# Patient Record
Sex: Female | Born: 1990 | Race: Black or African American | Hispanic: No | Marital: Married | Smoking: Never smoker
Health system: Southern US, Community
[De-identification: ages and names within clinical notes are randomized; demographics above are authoritative.]

## PROBLEM LIST (undated history)

## (undated) ENCOUNTER — Ambulatory Visit: Admission: EM | Payer: Medicaid Other

---

## 2008-01-01 ENCOUNTER — Ambulatory Visit: Payer: Self-pay | Admitting: Pediatrics

## 2009-01-16 IMAGING — CR DG CHEST 2V
1 series · 2 of 2 positions shown · non-contrast
Comparison: none

REASON FOR EXAM: chest pain rt lower anterior chest  fax  results
[id number]
COMMENTS:

[Series 1: view not recorded · 0.17mm/px · 2 of 2 slices shown]
[im 1/2]
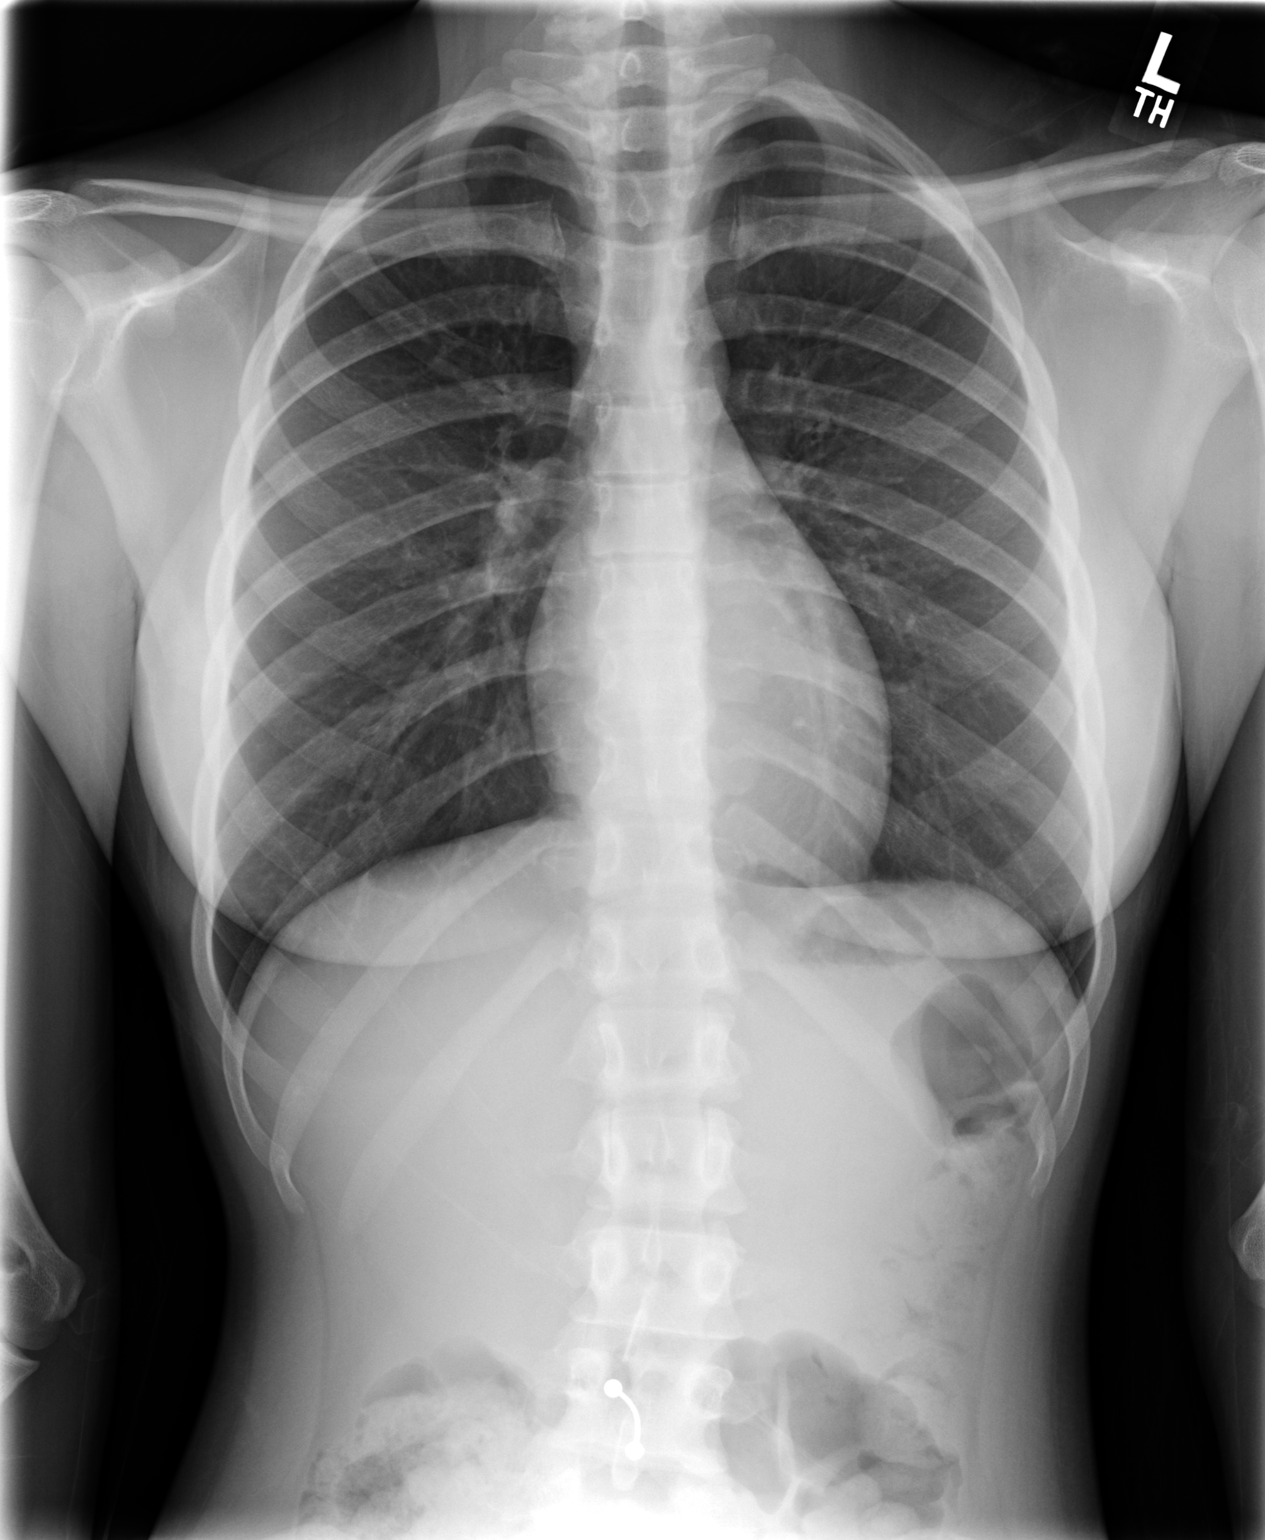
[im 2/2]
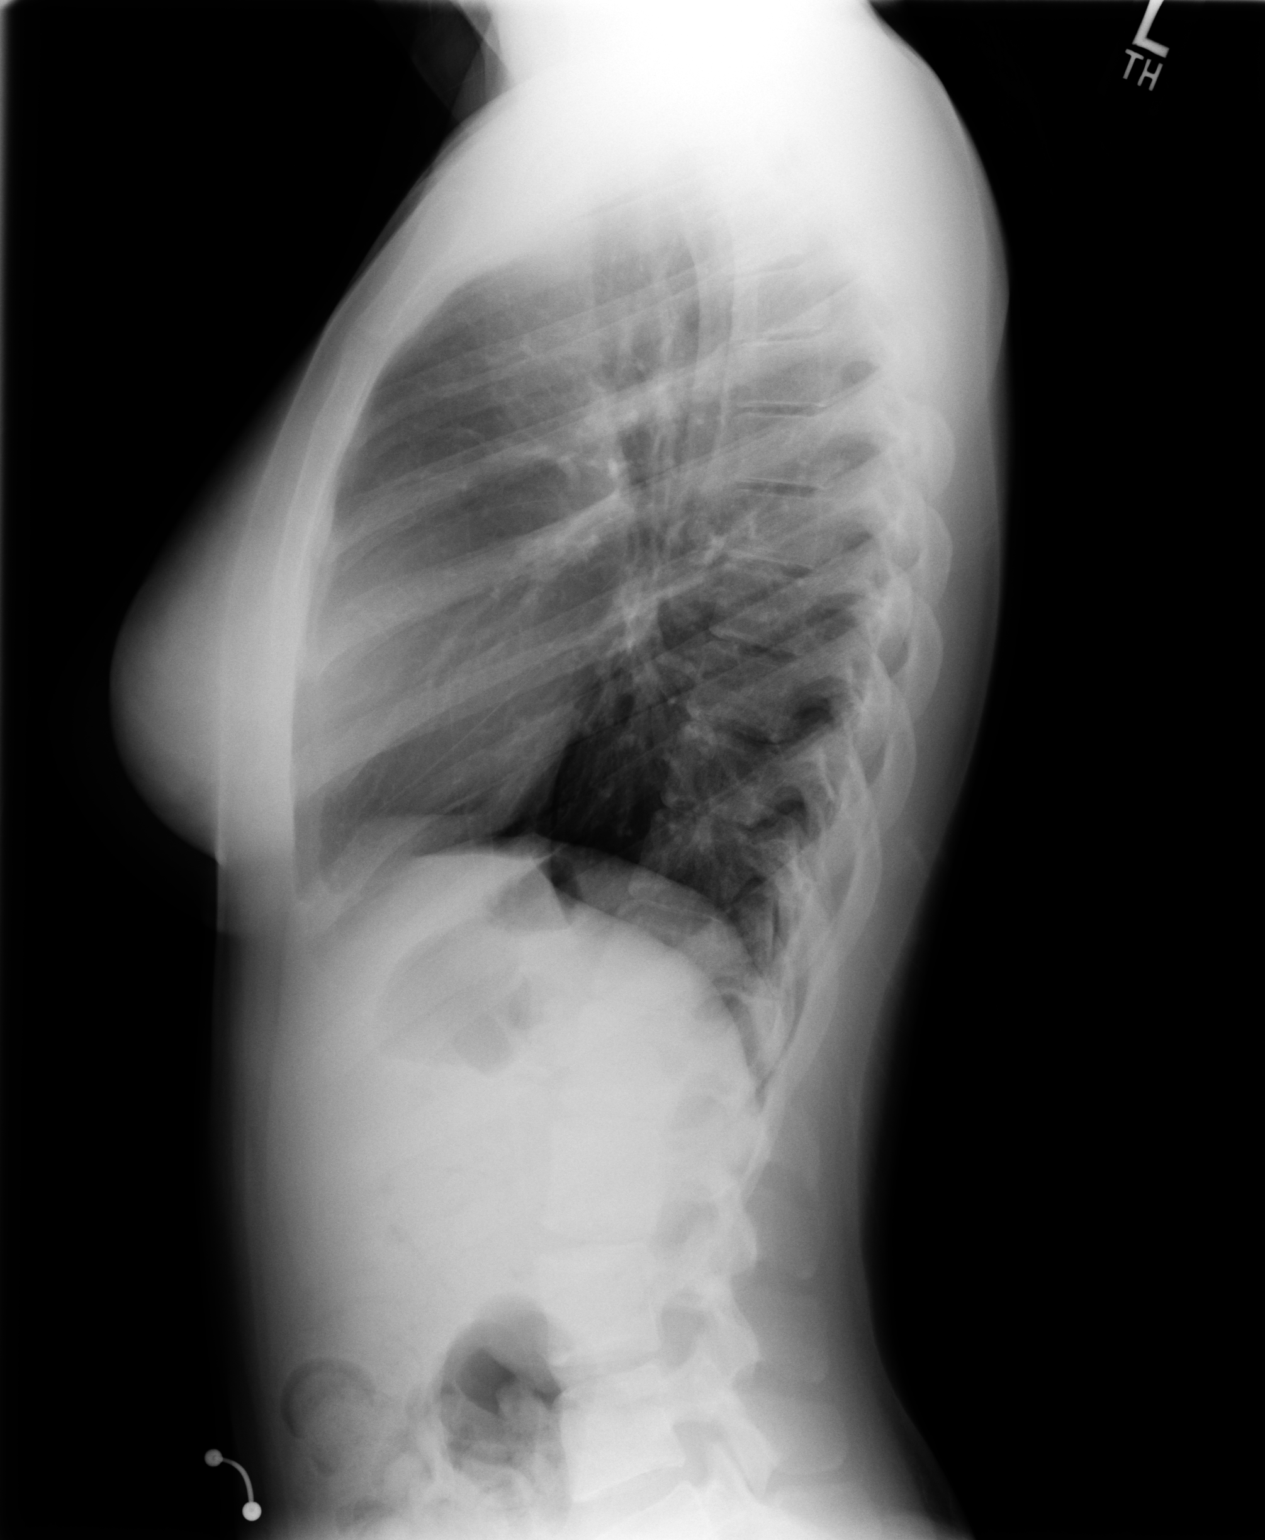

[2 of 2 positions shown; findings below may reference images not displayed]

PROCEDURE:     MDR - MDR CHEST PA(OR AP) AND LATERAL  - January 01, 2008  [DATE]

RESULT:     The lungs are mildly hyperinflated. There is no focal
infiltrate. The heart is normal in size. The pulmonary vascularity is not
engorged. The rib cage appears intact. The gas pattern in the upper abdomen
is normal. There is an umbilical ring present. The mediastinum is not
widened.
IMPRESSION: There is mild hyperinflation which may be voluntary or
which could reflect an element of reactive airway disease. There is no
evidence of pneumonia or of a pneumothorax.

## 2010-05-01 ENCOUNTER — Emergency Department: Payer: Self-pay | Admitting: Internal Medicine

## 2011-12-04 HISTORY — PX: CERVICAL CERCLAGE: SHX1329

## 2012-06-04 ENCOUNTER — Ambulatory Visit: Payer: Self-pay | Admitting: Obstetrics and Gynecology

## 2012-06-06 ENCOUNTER — Ambulatory Visit: Payer: Self-pay | Admitting: Obstetrics and Gynecology

## 2012-08-05 ENCOUNTER — Observation Stay: Payer: Self-pay | Admitting: Obstetrics and Gynecology

## 2012-08-05 LAB — URINALYSIS, COMPLETE
Bilirubin,UR: NEGATIVE
Glucose,UR: NEGATIVE mg/dL (ref 0–75)
Ketone: NEGATIVE
Leukocyte Esterase: NEGATIVE
RBC,UR: <1 /HPF (ref 0–5)
Squamous Epithelial: 1
WBC UR: 1 /HPF (ref 0–5)

## 2012-08-05 LAB — FETAL FIBRONECTIN
Appearance: NORMAL
Fetal Fibronectin: NEGATIVE

## 2012-10-22 ENCOUNTER — Inpatient Hospital Stay: Payer: Self-pay | Admitting: Obstetrics and Gynecology

## 2012-10-22 LAB — CBC WITH DIFFERENTIAL/PLATELET
Eosinophil %: 0.9 %
Lymphocyte #: 2 10*3/uL (ref 1.0–3.6)
Lymphocyte %: 22.6 %
MCH: 31.5 pg (ref 26.0–34.0)
Monocyte #: 1 x10 3/mm — ABNORMAL HIGH (ref 0.2–0.9)
Monocyte %: 11.4 %
Platelet: 198 10*3/uL (ref 150–440)

## 2012-10-23 LAB — HEMATOCRIT: HCT: 33.8 % — ABNORMAL LOW (ref 35.0–47.0)

## 2015-03-27 NOTE — Op Note (Signed)
PATIENT NAME:  Michele Salinas, Michele Salinas MR#:  [id number] DATE OF BIRTH:  May 27, 1991  DATE OF PROCEDURE:  06/06/2012  PREOPERATIVE DIAGNOSIS: Cervical insufficiency.   POSTOPERATIVE DIAGNOSIS:  Cervical insufficiency.  PROCEDURE: Emergency McDonald cervical cerclage.   ANESTHESIA: General.   PRIMARY SURGEON: Lorrene ReidAndreas M Maryela Tapper, M.D.   ASSISTANT: Deno LungerPhil Rosenow, M.D.   ESTIMATED BLOOD LOSS: Minimal.   COMPLICATIONS: None.   INTRAOPERATIVE FINDINGS: The patient had been noted on routine 20- week anatomy ultrasound to have approximately 1.5 cm of cervical dilation involving the entire length of the cervix. The length of the cervix was noted to be normal at approximately 3.5 cm.  Exam today  revealed the cervix to be visually about 1 cm dilated with view of the fetal membrane seen at the level of the internal cervical os, but no prolapse of membranes through the os itself. Following the procedure the cervix was noted to be hemostatic with closure of the cervical canal to approximately fingertip.   DRAINS OR TUBES: None.   SPECIMENS: None.   CONDITION FOLLOWING PROCEDURE: Stable. Positive fetal heart tones were noted pre- and postprocedure.   PROCEDURE IN DETAIL: Risks, benefits, and alternatives of the procedure including expectant management with hospital bed rest were discussed with the patient who elected to proceed with operative management via cervical cerclage. The patient was taken back to the operating room and placed under general endotracheal anesthesia. She was positioned in the dorsal lithotomy position and prepped and draped in the usual sterile fashion. The patient was placed in slight Trendelenburg. A short weighted speculum was placed. The cervix was clearly visualized, noting the above findings. The anterior lip of the cervix was grasped with a ring forceps and a widow needle was used to place a #2 Prolene suture for the McDonald cerclage. McDonald cerclage was placed in the usual  fashion with entry at 12:00, exit of the suture at 9:00, and repeat entry at 9:00 with exit at 6:00, entry at 6:00 with exit at 3:00, and entry at 3:00 with exit at 12:00. The suture was then tied down with the knot at 12:00. No air knots were employed.  The suture, as previously stated, was cinched down to close the cervical canal to approximately fingertip. During the procedure there was minimal bleeding noted and following tying of the suture the cervix was noted to be hemostatic. Sponge, needle, and instrument counts were correct times two.  The patient tolerated the procedure well and was taken to the recovery room in stable condition.   ____________________________ Florina OuAndreas M. Bonney AidStaebler, MD ams:bjt D: 06/07/2012 08:10:14 ET T: 06/07/2012 14:15:42 ET JOB#: [id number]  cc: Florina OuAndreas M. Bonney AidStaebler, MD, <Dictator> Carmel SacramentoANDREAS Cathrine MusterM Permelia Bamba MD ELECTRONICALLY SIGNED 06/15/2012 11:11

## 2015-04-12 NOTE — H&P (Signed)
L&D Evaluation:  History Expanded:   HPI 24 yo G3P0020 at 29 weeks pregnancy who is having abdomin\al pain. it is mostly with fetal movement. the patient has the significant history for cerclage on July 5 of this pregnancy. she is not bleeding and ni leakage of fluid.    Gravida 3    Term 0    PreTerm 0    Abortion 2    Living 0    Blood Type (Maternal) O positive    Group B Strep Results Maternal (Result >5wks must be treated as unknown) unknown/result > 5 weeks ago    Maternal HIV Negative    Maternal Syphilis Ab Nonreactive    Maternal Varicella Non-Immune    Rubella Results (Maternal) immune    Maternal T-Dap Immune    [facility]    Presents with abdominal pain    Patient's Medical History other    Patient's Surgical History none    Medications Pre Natal Vitamins    Allergies NKDA    Social History none    Family History Non-Contributory    Current Prenatal Course Notable For Incompetent Cervix   ROS:   ROS All systems were reviewed.  HEENT, CNS, GI, GU, Respiratory, CV, Renal and Musculoskeletal systems were found to be normal.   Exam:   Vital Signs stable    Urine Protein negative dipstick    General no apparent distress    Mental Status clear    Chest clear    Heart normal sinus rhythm    Abdomen gravid, non-tender    Estimated Fetal Weight Average for gestational age    Back no CVAT    Pelvic cervix closed and thick    Mebranes Intact    FHT normal rate with no decels    FHT Description strictly reactive no decels no contractions,    Ucx absent    Skin dry   Impression:   Impression active fetal movement   Plan:   Plan UA, EFM/NST, monitor contractions and for cervical change    Comments FFN   Electronic Signatures: Michele Salinas, Michele Salinas (MD)  (Signed 03-Sep-13 14:53)  Authored: L&D Evaluation   Last Updated: 03-Sep-13 14:53 by Michele Salinas, Danthony Kendrix (MD)

## 2017-10-07 ENCOUNTER — Other Ambulatory Visit: Payer: Self-pay

## 2017-10-07 ENCOUNTER — Emergency Department
Admission: EM | Admit: 2017-10-07 | Discharge: 2017-10-07 | Disposition: A | Discharge status: Home / Self Care | Payer: 59 | Attending: Emergency Medicine | Admitting: Emergency Medicine

## 2017-10-07 ENCOUNTER — Encounter: Payer: Self-pay | Admitting: Emergency Medicine

## 2017-10-07 DIAGNOSIS — M25561 Pain in right knee: Secondary | ICD-10-CM | POA: Diagnosis present

## 2017-10-07 DIAGNOSIS — Y999 Unspecified external cause status: Secondary | ICD-10-CM | POA: Insufficient documentation

## 2017-10-07 DIAGNOSIS — S8001XA Contusion of right knee, initial encounter: Secondary | ICD-10-CM | POA: Diagnosis not present

## 2017-10-07 DIAGNOSIS — Y929 Unspecified place or not applicable: Secondary | ICD-10-CM | POA: Diagnosis not present

## 2017-10-07 DIAGNOSIS — Y939 Activity, unspecified: Secondary | ICD-10-CM | POA: Insufficient documentation
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
## 2017-10-29 ENCOUNTER — Other Ambulatory Visit: Payer: Self-pay

## 2017-10-29 ENCOUNTER — Encounter (HOSPITAL_COMMUNITY): Payer: Self-pay | Admitting: Emergency Medicine

## 2017-10-29 ENCOUNTER — Ambulatory Visit (HOSPITAL_COMMUNITY)
Admission: EM | Admit: 2017-10-29 | Discharge: 2017-10-29 | Disposition: A | Discharge status: Home / Self Care | Payer: Self-pay | Attending: Family Medicine | Admitting: Family Medicine

## 2017-10-29 DIAGNOSIS — M62838 Other muscle spasm: Secondary | ICD-10-CM

## 2017-10-29 DIAGNOSIS — M62830 Muscle spasm of back: Secondary | ICD-10-CM

## 2017-10-29 MED ORDER — DICLOFENAC SODIUM 75 MG PO TBEC: 75.0000 mg | DELAYED_RELEASE_TABLET | Freq: Two times a day (BID) | ORAL | 0 refills | Status: DC

## 2017-10-29 MED ORDER — CYCLOBENZAPRINE HCL 10 MG PO TABS: 10.0000 mg | ORAL_TABLET | Freq: Three times a day (TID) | ORAL | 0 refills | Status: DC
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
## 2018-01-07 ENCOUNTER — Other Ambulatory Visit: Payer: Self-pay

## 2018-01-07 ENCOUNTER — Ambulatory Visit
Admission: EM | Admit: 2018-01-07 | Discharge: 2018-01-07 | Disposition: A | Discharge status: Home / Self Care | Payer: 59 | Attending: Family Medicine | Admitting: Family Medicine

## 2018-01-07 ENCOUNTER — Encounter: Payer: Self-pay | Admitting: *Deleted

## 2018-01-07 DIAGNOSIS — J069 Acute upper respiratory infection, unspecified: Secondary | ICD-10-CM

## 2018-01-07 MED ORDER — HYDROCOD POLST-CPM POLST ER 10-8 MG/5ML PO SUER: 5.0000 mL | Freq: Two times a day (BID) | ORAL | 0 refills | Status: DC

## 2018-01-07 MED ORDER — BENZONATATE 200 MG PO CAPS: ORAL_CAPSULE | ORAL | 0 refills | Status: DC

## 2018-01-07 NOTE — ED Provider Notes (Signed)
[facility]    CSN: [id number] Arrival date & time: 01/07/18  1039     History   Chief Complaint Chief Complaint  Patient presents with  . Cough  . Generalized Body Aches  . Chills    HPI Michele Salinas is a 27 y.o. female.   HPI  27 year old female accompanied by her mother presents with of cough chest congestion and discomfort when coughing body aches and fever up to 101 she started having yesterday.  Today she does feel better.  The patient does not look ill or toxic.  States she is afebrile blood pressure 121/70 and O2 sats 100% on room air.  Her daughter had similar symptoms earlier in the week.  May been the source of her illness.      History reviewed. No pertinent past medical history.  There are no active problems to display for this patient.   History reviewed. No pertinent surgical history.  OB History    No data available       Home Medications    Prior to Admission medications   Medication Sig Start Date End Date Taking? Authorizing Provider  benzonatate (TESSALON) 200 MG capsule Take one cap TID PRN cough 01/07/18   Lutricia Feiloemer, William P, PA-C  chlorpheniramine-HYDROcodone [facility](TUSSIONEX PENNKINETIC ER) 10-8 MG/5ML SUER Take 5 mLs by mouth 2 (two) times daily. 01/07/18   Lutricia Feiloemer, William P, PA-C    Family History Family History  Problem Relation Age of Onset  . Healthy Mother   . Hypertension Father     Social History Social History   Tobacco Use  . Smoking status: Never Smoker  . Smokeless tobacco: Never Used  Substance Use Topics  . Alcohol use: Yes    Comment: occasional  . Drug use: No     Allergies   Patient has no known allergies.   Review of Systems Review of Systems  Constitutional: Positive for activity change and chills. Negative for fatigue and fever.  HENT: Positive for congestion.   Respiratory: Positive for cough. Negative for shortness of breath, wheezing and stridor.   All other systems reviewed and are  negative.    Physical Exam Triage Vital Signs ED Triage Vitals  Enc Vitals Group     BP 01/07/18 1047 121/70     Pulse Rate 01/07/18 1047 96     Resp 01/07/18 1047 16     Temp 01/07/18 1047 98.6 F (37 C)     Temp Source 01/07/18 1047 Oral     SpO2 01/07/18 1047 100 %     Weight 01/07/18 1049 130 lb (59 kg)     Height 01/07/18 1049 5\' 2"  (1.575 m)     Head Circumference --      Peak Flow --      Pain Score 01/07/18 1049 0     Pain Loc --      Pain Edu? --      Excl. in GC? --    No data found.  Updated Vital Signs BP 121/70 (BP Location: Left Arm)   Pulse 96   Temp 98.6 F (37 C) (Oral)   Resp 16   Ht 5\' 2"  (1.575 m)   Wt 130 lb (59 kg)   LMP 12/18/2017 (Approximate)   SpO2 100%   BMI 23.78 kg/m   Visual Acuity Right Eye Distance:   Left Eye Distance:   Bilateral Distance:    Right Eye Near:   Left Eye Near:    Bilateral Near:  Physical Exam  Constitutional: She is oriented to person, place, and time. She appears well-developed and well-nourished. No distress.  HENT:  Head: Normocephalic.  Right Ear: External ear normal.  Left Ear: External ear normal.  Nose: Nose normal.  Mouth/Throat: Oropharynx is clear and moist. No oropharyngeal exudate.  Eyes: Pupils are equal, round, and reactive to light. Right eye exhibits no discharge. Left eye exhibits no discharge.  Neck: Normal range of motion.  Pulmonary/Chest: Effort normal and breath sounds normal.  Musculoskeletal: Normal range of motion.  Lymphadenopathy:    She has no cervical adenopathy.  Neurological: She is alert and oriented to person, place, and time.  Skin: Skin is warm and dry. She is not diaphoretic.  Psychiatric: She has a normal mood and affect. Her behavior is normal. Judgment and thought content normal.  Nursing note and vitals reviewed.    UC Treatments / Results  Labs (all labs ordered are listed, but only abnormal results are displayed) Labs Reviewed - No data to  display  EKG  EKG Interpretation None       Radiology No results found.  Procedures Procedures (including critical care time)  Medications Ordered in UC Medications - No data to display   Initial Impression / Assessment and Plan / UC Course  I have reviewed the triage vital signs and the nursing notes.  Pertinent labs & imaging results that were available during my care of the patient were reviewed by me and considered in my medical decision making (see chart for details).     Plan: 1. Test/x-ray results and diagnosis reviewed with patient 2. rx as per orders; risks, benefits, potential side effects reviewed with patient 3. Recommend supportive treatment with rest and fluids.  Treat symptomatically.  Told patient this is most likely viral and does not require any antibiotics.  If however she continued to have symptoms in 1 week would recommend she follow-up with her primary care physician. 4. F/u prn if symptoms worsen or don't improve   Final Clinical Impressions(s) / UC Diagnoses   Final diagnoses:  Upper respiratory tract infection, unspecified type    ED Discharge Orders        Ordered    benzonatate (TESSALON) 200 MG capsule     01/07/18 1201    chlorpheniramine-HYDROcodone (TUSSIONEX PENNKINETIC ER) 10-8 MG/5ML SUER  2 times daily     01/07/18 1201       Controlled Substance Prescriptions [place] Controlled Substance Registry consulted? Not Applicable   Lutricia Feil, PA-C 01/07/18 1214

## 2018-01-07 NOTE — ED Triage Notes (Signed)
PAtient started having symptoms of cough, chest congestion, body aches and fever yesterday.

## 2018-01-10 ENCOUNTER — Telehealth: Payer: Self-pay | Admitting: Emergency Medicine

## 2018-01-10 NOTE — Telephone Encounter (Signed)
Called to follow-up with patient [facility].  Patient states that they are feeling better.

## 2018-10-01 DIAGNOSIS — R350 Frequency of micturition: Secondary | ICD-10-CM | POA: Diagnosis not present

## 2019-04-21 ENCOUNTER — Ambulatory Visit: Payer: Self-pay | Admitting: Obstetrics and Gynecology

## 2019-04-23 ENCOUNTER — Ambulatory Visit: Payer: 59 | Admitting: Obstetrics and Gynecology

## 2019-05-01 ENCOUNTER — Ambulatory Visit (INDEPENDENT_AMBULATORY_CARE_PROVIDER_SITE_OTHER): Payer: 59 | Admitting: Obstetrics and Gynecology

## 2019-05-01 ENCOUNTER — Other Ambulatory Visit: Payer: Self-pay

## 2019-05-01 ENCOUNTER — Encounter: Payer: Self-pay | Admitting: Obstetrics and Gynecology

## 2019-05-01 VITALS — BP 112/60 | HR 81 | Ht 62.0 in | Wt 135.0 lb

## 2019-05-01 DIAGNOSIS — Z789 Other specified health status: Secondary | ICD-10-CM | POA: Diagnosis not present

## 2019-05-01 DIAGNOSIS — Z3009 Encounter for other general counseling and advice on contraception: Secondary | ICD-10-CM | POA: Diagnosis not present

## 2019-05-01 MED ORDER — NORELGESTROMIN-ETH ESTRADIOL 150-35 MCG/24HR TD PTWK: 1.0000 | MEDICATED_PATCH | TRANSDERMAL | 12 refills | Status: DC

## 2019-05-01 NOTE — Progress Notes (Signed)
Patient ID: Michele Salinas, female   DOB: Dec 11, 1990, 28 y.o.   MRN: [id number]  Reason for Consult: Contraception (Nexplanon removal, would like BC to replace )   Referred by [facility] [place] [facility]  Subjective:     HPI:  Michele Salinas is a 28 y.o. female. She would like to transition from a Nexplanon to a different contraceptive device. She was happy with the Nexplanon, but she recently passed 3 years with the device. She would like to switch to the contraceptive pill, patch or ring which is easier to stop because she would like to have another child in the future.  After discussing options she decided to try the patch.   History reviewed. No pertinent past medical history. Family History  Problem Relation Age of Onset  . Healthy Mother   . Hypertension Father    Past Surgical History:  Procedure Laterality Date  . CERVICAL CERCLAGE  2013    Short Social History:  Social History   Tobacco Use  . Smoking status: Never Smoker  . Smokeless tobacco: Never Used  Substance Use Topics  . Alcohol use: Yes    Comment: occasional    No Known Allergies  Current Outpatient Medications  Medication Sig Dispense Refill  . norelgestromin-ethinyl estradiol [facility]) 150-35 MCG/24HR transdermal patch Place 1 patch onto the skin once a week. 3 patch 12   No current facility-administered medications for this visit.     Review of Systems  Constitutional: Negative for chills, fatigue, fever and unexpected weight change.  HENT: Negative for trouble swallowing.  Eyes: Negative for loss of vision.  Respiratory: Negative for cough, shortness of breath and wheezing.  Cardiovascular: Negative for chest pain, leg swelling, palpitations and syncope.  GI: Negative for abdominal pain, blood in stool, diarrhea, nausea and vomiting.  GU: Negative for difficulty urinating, dysuria, frequency and hematuria.  Musculoskeletal: Negative for back pain, leg pain and joint pain.  Skin:  Negative for rash.  Neurological: Negative for dizziness, headaches, light-headedness, numbness and seizures.  Psychiatric: Negative for behavioral problem, confusion, depressed mood and sleep disturbance.        Objective:  Objective   Vitals:   05/01/19 1332  BP: 112/60  Pulse: 81  Weight: 135 lb (61.2 kg)  Height: 5\' 2"  (1.575 m)   Body mass index is 24.69 kg/m.  Physical Exam Vitals signs and nursing note reviewed.  Constitutional:      Appearance: She is well-developed.  HENT:     Head: Normocephalic and atraumatic.  Eyes:     Pupils: Pupils are equal, round, and reactive to light.  Cardiovascular:     Rate and Rhythm: Normal rate and regular rhythm.  Pulmonary:     Effort: Pulmonary effort is normal. No respiratory distress.  Skin:    General: Skin is warm and dry.  Neurological:     Mental Status: She is alert and oriented to person, place, and time.  Psychiatric:        Behavior: Behavior normal.        Thought Content: Thought content normal.        Judgment: Judgment normal.       Assessment/Plan:      28 yo here for birth control discussion and care Would like to transition from a nexplanon to a patch- Discussed options for transitioning methods of the overlap or back up method. She would like to use the overlap method. She will initiate xulane today and will return in 7 days  to have the nexplanon removed. Discussed Annual and pap smear which we can complete next week as well.   More than 20 minutes were spent face to face with the patient in the room with more than 50% of the time spent providing counseling and discussing the plan of management.    Adelene Idlerhristanna Nyeisha Goodall MD [facility], [facility] 05/01/2019 2:11 PM

## 2019-05-13 DIAGNOSIS — F418 Other specified anxiety disorders: Secondary | ICD-10-CM | POA: Diagnosis not present

## 2019-05-21 ENCOUNTER — Ambulatory Visit (INDEPENDENT_AMBULATORY_CARE_PROVIDER_SITE_OTHER): Payer: 59 | Admitting: Obstetrics and Gynecology

## 2019-05-21 ENCOUNTER — Other Ambulatory Visit: Payer: Self-pay

## 2019-05-21 ENCOUNTER — Encounter: Payer: Self-pay | Admitting: Obstetrics and Gynecology

## 2019-05-21 VITALS — BP 98/52 | HR 62 | Ht 62.0 in | Wt 138.0 lb

## 2019-05-21 DIAGNOSIS — Z3046 Encounter for surveillance of implantable subdermal contraceptive: Secondary | ICD-10-CM

## 2019-05-21 DIAGNOSIS — Z3049 Encounter for surveillance of other contraceptives: Secondary | ICD-10-CM

## 2019-05-21 NOTE — Progress Notes (Signed)
  Nexplanon removal Procedure note - The Nexplanon was noted in the patient's arm and the end was identified. The skin was cleansed with a Betadine solution. A small injection of subcutaneous lidocaine with epinephrine was given over the end of the implant. An incision was made at the end of the implant. The rod was noted in the incision and grasped with a hemostat. It was noted to be intact.  Steri-Strip was placed approximating the incision. Hemostasis was noted. She will continue with xulane for contraception. She initiated xulane 2 weeks ago. She will return for an annual soon.   Homero Fellers ,MD 05/21/2019,2:11 PM

## 2019-06-08 DIAGNOSIS — F418 Other specified anxiety disorders: Secondary | ICD-10-CM | POA: Diagnosis not present

## 2019-06-21 ENCOUNTER — Other Ambulatory Visit: Payer: Self-pay | Admitting: Obstetrics and Gynecology

## 2019-06-21 DIAGNOSIS — Z789 Other specified health status: Secondary | ICD-10-CM

## 2019-06-21 DIAGNOSIS — Z3009 Encounter for other general counseling and advice on contraception: Secondary | ICD-10-CM

## 2019-06-24 ENCOUNTER — Telehealth: Payer: Self-pay

## 2019-06-24 NOTE — Telephone Encounter (Signed)
Patient is calling to speak with a nurse.    Please advise

## 2019-06-24 NOTE — Telephone Encounter (Signed)
Spoke with pt she is stating she is having bad cramping from the patch and nausea. She would like to go back to Lo Loestrin if possible. I have advise pt that CRS is not in office today but will send to her and she will see it tomorrow

## 2019-06-24 NOTE — Telephone Encounter (Signed)
Pt calling; bc was changed to patches 11m ago; feels sick on her stomach all the time c the patch.  Can she go back to Lo Loestrin?  [phone]

## 2019-06-25 ENCOUNTER — Other Ambulatory Visit: Payer: Self-pay | Admitting: Obstetrics and Gynecology

## 2019-06-25 DIAGNOSIS — Z3009 Encounter for other general counseling and advice on contraception: Secondary | ICD-10-CM

## 2019-06-25 MED ORDER — LO LOESTRIN FE 1 MG-10 MCG / 10 MCG PO TABS: 1.0000 | ORAL_TABLET | Freq: Every day | ORAL | 3 refills | Status: DC

## 2019-06-25 NOTE — Telephone Encounter (Signed)
Please inform that prescription was sent. Thank you!

## 2019-06-25 NOTE — Telephone Encounter (Signed)
Pt aware via VM

## 2019-07-01 NOTE — Telephone Encounter (Signed)
Spoke w/patient. She reports she stopped the xulane (literally took it off about an hour ago) d/t nausea she just couldn't stand it any longer. She was on Lo Lo before. Advised to check her insurance coverage by her plan to see what is covered and call back to notify us prior to anything additional being sent in. Pt will call back by tomorrow. Pt aware [facility] out of office til tomorrow.

## 2019-07-01 NOTE — Telephone Encounter (Signed)
Pt states she received notification from her pharmacy that the new OCP rx that was sent in isn't covered by her insurance [facility]). Pt requesting rx that is covered. GD#[phone]

## 2019-07-02 ENCOUNTER — Other Ambulatory Visit: Payer: Self-pay | Admitting: Obstetrics and Gynecology

## 2019-07-02 DIAGNOSIS — Z3009 Encounter for other general counseling and advice on contraception: Secondary | ICD-10-CM

## 2019-07-02 MED ORDER — LARIN 24 FE 1-20 MG-MCG(24) PO TABS: 1.0000 | ORAL_TABLET | Freq: Every day | ORAL | 11 refills | Status: DC

## 2019-08-19 ENCOUNTER — Telehealth: Payer: Self-pay

## 2019-08-19 DIAGNOSIS — Z3009 Encounter for other general counseling and advice on contraception: Secondary | ICD-10-CM

## 2019-08-19 MED ORDER — LARIN 24 FE 1-20 MG-MCG(24) PO TABS: 1.0000 | ORAL_TABLET | Freq: Every day | ORAL | 10 refills | Status: DC

## 2019-08-19 NOTE — Telephone Encounter (Signed)
LMVM TRC. Notified of med on back order per pharmacy. Pt advised Gilman Schmidt out of office today. Pt TRC to notify how soon she needs refill.

## 2019-08-19 NOTE — Telephone Encounter (Signed)
Patient is took last pill last night . Please advise

## 2019-08-19 NOTE — Telephone Encounter (Signed)
Pharmacist w/[facility] calling to notify that Michele Salinas/Junel 24 FE is on back order. Requesting permission to change to Norethendrone Ethinyl Estradiol 1/20 w/o FE #21.

## 2019-08-19 NOTE — Telephone Encounter (Signed)
Notified pt per ABC, this seems to be a [facility] issue as this [facility] is available at [facility]. Patient requested rx be sent to [facility]. Rx sent #1 w/10 refills.

## 2019-10-04 ENCOUNTER — Other Ambulatory Visit: Payer: Self-pay | Admitting: Obstetrics and Gynecology

## 2019-10-04 DIAGNOSIS — Z3009 Encounter for other general counseling and advice on contraception: Secondary | ICD-10-CM

## 2019-12-17 ENCOUNTER — Telehealth: Payer: Self-pay

## 2019-12-17 NOTE — Telephone Encounter (Signed)
Patient states she is expecting and has a few questions in regards to symptoms. VQ#[phone]

## 2019-12-17 NOTE — Telephone Encounter (Signed)
Spoke w/pt. She reports eating at [employer] yesterday and had bad stomach cramps about an hour later followed by diarrhea. She feels better today and bowel movements have returned to normal. She denies vaginal bleeding/spotting.  Reports having a low grade fever 99.1-100. Advised to monitor. Can take Tylenol. Will call back with any additional s&s/concerns.

## 2019-12-23 ENCOUNTER — Telehealth: Payer: Self-pay

## 2019-12-23 NOTE — Telephone Encounter (Signed)
Pt calling; is early preg - LMP 11/08/19; c/o had some spotting, not keeping anything down, feels weak; covid test neg; HROB; has concerns.  [phone]  Pt states had IC within 24hrs of spotting; adv probably from that.  Pt wants to know if needs to abstain from IC at least until NOB appt on 2/8 - had cerclage last preg.  Pt did keep one meal down yesterday.  Adv if doesn't keep liquids down x24hrs to be seen.  Can she have the chewable tablet for nausea rx'd?  Adv will send msg to [facility]MS.

## 2019-12-23 NOTE — Telephone Encounter (Signed)
Advise

## 2019-12-23 NOTE — Telephone Encounter (Signed)
Abstaining from intercourse would avoid additional cramping so might be reasonable until we see her

## 2019-12-24 ENCOUNTER — Other Ambulatory Visit: Payer: Self-pay | Admitting: Advanced Practice Midwife

## 2019-12-24 DIAGNOSIS — O219 Vomiting of pregnancy, unspecified: Secondary | ICD-10-CM

## 2019-12-24 MED ORDER — ONDANSETRON 4 MG PO TBDP: 4.0000 mg | ORAL_TABLET | Freq: Four times a day (QID) | ORAL | 2 refills | Status: DC | PRN

## 2019-12-24 NOTE — Telephone Encounter (Signed)
Please send in nausea medication until her appointment. [facility]MS is out of the office

## 2019-12-24 NOTE — Telephone Encounter (Signed)
Please let her know what AMS said about IC. Also I sent zofran ODT to her pharmacy (dissolvable) I think that is what she meant by chewable.

## 2019-12-24 NOTE — Progress Notes (Unsigned)
Rx zofran 4 mg ODT sent to patient's pharmacy.

## 2020-01-11 ENCOUNTER — Other Ambulatory Visit: Payer: Self-pay

## 2020-01-11 ENCOUNTER — Ambulatory Visit (INDEPENDENT_AMBULATORY_CARE_PROVIDER_SITE_OTHER): Payer: BC Managed Care – PPO | Admitting: Obstetrics and Gynecology

## 2020-01-11 ENCOUNTER — Encounter: Payer: Self-pay | Admitting: Obstetrics and Gynecology

## 2020-01-11 VITALS — BP 124/62 | Wt 136.0 lb

## 2020-01-11 DIAGNOSIS — Z3689 Encounter for other specified antenatal screening: Secondary | ICD-10-CM

## 2020-01-11 DIAGNOSIS — Z9889 Other specified postprocedural states: Secondary | ICD-10-CM | POA: Insufficient documentation

## 2020-01-11 DIAGNOSIS — Z113 Encounter for screening for infections with a predominantly sexual mode of transmission: Secondary | ICD-10-CM

## 2020-01-11 DIAGNOSIS — Z3480 Encounter for supervision of other normal pregnancy, unspecified trimester: Secondary | ICD-10-CM | POA: Insufficient documentation

## 2020-01-11 DIAGNOSIS — Z3201 Encounter for pregnancy test, result positive: Secondary | ICD-10-CM

## 2020-01-11 DIAGNOSIS — O09299 Supervision of pregnancy with other poor reproductive or obstetric history, unspecified trimester: Secondary | ICD-10-CM | POA: Insufficient documentation

## 2020-01-11 DIAGNOSIS — Z3A09 9 weeks gestation of pregnancy: Secondary | ICD-10-CM

## 2020-01-11 DIAGNOSIS — O0990 Supervision of high risk pregnancy, unspecified, unspecified trimester: Secondary | ICD-10-CM | POA: Insufficient documentation

## 2020-01-11 DIAGNOSIS — O3430 Maternal care for cervical incompetence, unspecified trimester: Secondary | ICD-10-CM | POA: Insufficient documentation

## 2020-01-11 LAB — POCT URINE PREGNANCY: Preg Test, Ur: POSITIVE — AB

## 2020-01-11 NOTE — Addendum Note (Signed)
Addended by: Lorrene Reid on: 01/11/2020 12:56 PM   Modules accepted: Orders

## 2020-01-11 NOTE — Progress Notes (Signed)
NOB   Nausea 

## 2020-01-11 NOTE — Progress Notes (Signed)
New Obstetric Patient H&P    Chief Complaint: "Desires prenatal care"   History of Present Illness: Patient is a 29 y.o. G2P1001 Not Hispanic or Latino female, presents with amenorrhea and positive home pregnancy test. Patient's last menstrual period was 11/08/2019 (exact date). and based on her  LMP, her EDD is Estimated Date of Delivery: 08/14/20 and her EGA is [redacted]w[redacted]d. Cycles are regular monthly.   She had a urine pregnancy test which was positive 1 month(s)  ago. Her last menstrual period was normal. Since her LMP she claims she has experienced nausea, fatigue. She denies vaginal bleeding. Her past medical history is noncontributory. Her prior pregnancies are notable for cervical insufficency managed with cerclarge last pregnancy  Since her LMP, she admits to the use of tobacco products  no There are cats in the home in the home  no  She admits close contact with children on a regular basis  yes  She has had chicken pox in the past no She has had Tuberculosis exposures, symptoms, or previously tested positive for TB   no Current or past history of domestic violence. no  Genetic Screening/Teratology Counseling: (Includes patient, baby's father, or anyone in either family with:)   77. Patient's age >/= 31 at [facility]  no 2. Thalassemia ([place], [place], [place], or Asian background): MCV<80  no 3. Neural tube defect (meningomyelocele, spina bifida, anencephaly)  no 4. Congenital heart defect  no  5. Down syndrome  no 6. Tay-Sachs (Jewish, [place])  no 7. Canavan's Disease  no 8. Sickle cell disease or trait (African)  no  9. Hemophilia or other blood disorders  no  10. Muscular dystrophy  no  11. Cystic fibrosis  no  12. Huntington's Chorea  no  13. Mental retardation/autism  no 14. Other inherited genetic or chromosomal disorder  no 15. Maternal metabolic disorder (DM, PKU, etc)  no 16. Patient or FOB with a child with a birth defect not listed above no  16a. Patient or  FOB with a birth defect themselves no 17. Recurrent pregnancy loss, or stillbirth  no  18. Any medications since LMP other than prenatal vitamins (include vitamins, supplements, OTC meds, drugs, alcohol)  no 19. Any other genetic/environmental exposure to discuss  no  Infection History:   1. Lives with someone with TB or TB exposed  no  2. Patient or partner has history of genital herpes  no 3. Rash or viral illness since LMP  no 4. History of STI (GC, CT, HPV, syphilis, HIV)  no 5. History of recent travel :  no  Other pertinent information:  no     Review of Systems:10 point review of systems negative unless otherwise noted in HPI  Past Medical History:  History reviewed. No pertinent past medical history.  Past Surgical History:  Past Surgical History:  Procedure Laterality Date  . CERVICAL CERCLAGE  2013    Gynecologic History: Patient's last menstrual period was 11/08/2019 (exact date).  Obstetric History: G2P1001  Family History:  Family History  Problem Relation Age of Onset  . Healthy Mother   . Hypertension Father     Social History:  Social History   Socioeconomic History  . Marital status: Married    Spouse name: Not on file  . Number of children: Not on file  . Years of education: Not on file  . Highest education level: Not on file  Occupational History  . Not on file  Tobacco Use  . Smoking status: Never  Smoker  . Smokeless tobacco: Never Used  Substance and Sexual Activity  . Alcohol use: Yes    Comment: occasional  . Drug use: No  . Sexual activity: Yes    Birth control/protection: None  Other Topics Concern  . Not on file  Social History Narrative  . Not on file   Social Determinants of Health   Financial Resource Strain:   . Difficulty of Paying Living Expenses: Not on file  Food Insecurity:   . Worried About Programme researcher, broadcasting/film/video in the Last Year: Not on file  . Ran Out of Food in the Last Year: Not on file  Transportation Needs:    . Lack of Transportation (Medical): Not on file  . Lack of Transportation (Non-Medical): Not on file  Physical Activity:   . Days of Exercise per Week: Not on file  . Minutes of Exercise per Session: Not on file  Stress:   . Feeling of Stress : Not on file  Social Connections:   . Frequency of Communication with Friends and Family: Not on file  . Frequency of Social Gatherings with Friends and Family: Not on file  . Attends Religious Services: Not on file  . Active Member of Clubs or Organizations: Not on file  . Attends Banker Meetings: Not on file  . Marital Status: Not on file  Intimate Partner Violence:   . Fear of Current or Ex-Partner: Not on file  . Emotionally Abused: Not on file  . Physically Abused: Not on file  . Sexually Abused: Not on file    Allergies:  No Known Allergies  Medications: Prior to Admission medications   Medication Sig Start Date End Date Taking? Authorizing Provider  ondansetron (ZOFRAN ODT) 4 MG disintegrating tablet Take 1 tablet (4 mg total) by mouth every 6 (six) hours as needed for nausea. 12/24/19  Yes Tresea Mall, CNM    Physical Exam Vitals: Blood pressure 124/62, weight 136 lb (61.7 kg), last menstrual period 11/08/2019.  General: NAD HEENT: normocephalic, anicteric Thyroid: no enlargement, no palpable nodules Pulmonary: No increased work of breathing, CTAB Cardiovascular: RRR, distal pulses 2+ Abdomen: NABS, soft, non-tender, non-distended.  Umbilicus without lesions.  No hepatomegaly, splenomegaly or masses palpable. No evidence of hernia  Genitourinary: declined pelvic and pap today Extremities: no edema, erythema, or tenderness Neurologic: Grossly intact Psychiatric: mood appropriate, affect full   Assessment: 29 y.o. G2P1001 at [redacted]w[redacted]d presenting to initiate prenatal care  Plan: 1) Avoid alcoholic beverages. 2) Patient encouraged not to smoke.  3) Discontinue the use of all non-medicinal drugs and chemicals.    4) Take prenatal vitamins daily.  5) Nutrition, food safety (fish, cheese advisories, and high nitrite foods) and exercise discussed. 6) Hospital and practice style discussed with cross coverage system.  7) Genetic Screening, such as with 1st Trimester Screening, cell free fetal DNA, AFP testing, and Ultrasound, as well as with amniocentesis and [facility] as appropriate, we note discussed at today's visit  Vena Austria, MD, [facility], [facility] 01/11/2020, 9:32 AM

## 2020-01-13 LAB — URINE CULTURE

## 2020-01-13 LAB — CHLAMYDIA/GONOCOCCUS/TRICHOMONAS, NAA
Chlamydia by NAA: NEGATIVE
Gonococcus by NAA: NEGATIVE
Trich vag by NAA: NEGATIVE

## 2020-01-13 LAB — HEMOGLOBINOPATHY EVALUATION
HGB C: 0 %
HGB S: 0 %
HGB VARIANT: 0 %
Hemoglobin A2 Quantitation: 2.4 % (ref 1.8–3.2)
Hemoglobin F Quantitation: 0 % (ref 0.0–2.0)
Hgb A: 97.6 % (ref 96.4–98.8)

## 2020-01-13 LAB — RPR+RH+ABO+RUB AB+AB SCR+CB...
Antibody Screen: NEGATIVE
HIV Screen 4th Generation wRfx: NONREACTIVE
Hematocrit: 40.1 % (ref 34.0–46.6)
Hemoglobin: 13.7 g/dL (ref 11.1–15.9)
Hepatitis B Surface Ag: NEGATIVE
MCH: 30 pg (ref 26.6–33.0)
MCHC: 34.2 g/dL (ref 31.5–35.7)
MCV: 88 fL (ref 79–97)
Platelets: 315 10*3/uL (ref 150–450)
RBC: 4.57 x10E6/uL (ref 3.77–5.28)
RDW: 12.7 % (ref 11.7–15.4)
RPR Ser Ql: NONREACTIVE
Rh Factor: POSITIVE
Rubella Antibodies, IGG: 2.63 index (ref >0.99)
Varicella zoster IgG: <135 index — ABNORMAL LOW (ref >165)
WBC: 7.5 10*3/uL (ref 3.4–10.8)

## 2023-03-26 ENCOUNTER — Encounter (INDEPENDENT_AMBULATORY_CARE_PROVIDER_SITE_OTHER): Payer: No Typology Code available for payment source | Admitting: Ophthalmology

## 2023-03-26 DIAGNOSIS — H538 Other visual disturbances: Secondary | ICD-10-CM

## 2023-03-26 DIAGNOSIS — H31023 Solar retinopathy, bilateral: Secondary | ICD-10-CM

## 2023-04-25 ENCOUNTER — Ambulatory Visit (INDEPENDENT_AMBULATORY_CARE_PROVIDER_SITE_OTHER): Payer: No Typology Code available for payment source | Admitting: Certified Nurse Midwife

## 2023-04-25 VITALS — BP 124/85 | HR 72 | Wt 149.1 lb

## 2023-04-25 DIAGNOSIS — Z30018 Encounter for initial prescription of other contraceptives: Secondary | ICD-10-CM

## 2023-04-25 DIAGNOSIS — T192XXA Foreign body in vulva and vagina, initial encounter: Secondary | ICD-10-CM | POA: Diagnosis not present

## 2023-04-26 ENCOUNTER — Encounter: Payer: Self-pay | Admitting: Certified Nurse Midwife

## 2023-04-26 ENCOUNTER — Other Ambulatory Visit: Payer: Self-pay | Admitting: Certified Nurse Midwife

## 2023-04-26 MED ORDER — METRONIDAZOLE 500 MG PO TABS: 500.0000 mg | ORAL_TABLET | Freq: Two times a day (BID) | ORAL | 0 refills | Status: AC

## 2023-04-26 MED ORDER — PHEXXI 1.8-1-0.4 % VA GEL: 1.0000 | VAGINAL | 11 refills | Status: DC | PRN

## 2023-04-26 MED ORDER — FLUCONAZOLE 150 MG PO TABS: 150.0000 mg | ORAL_TABLET | Freq: Once | ORAL | 0 refills | Status: AC

## 2023-04-26 NOTE — Progress Notes (Signed)
[place], [facility]   No chief complaint on file.   HPI:      Ms. Michele Salinas is a 32 y.o. G2P1001 whose LMP was No LMP recorded., presents today for retained condom.    Patient Active Problem List   Diagnosis Date Noted   Supervision of high risk pregnancy, antepartum 01/11/2020   Supervision of other normal pregnancy, antepartum 01/11/2020   Cervical insufficiency during pregnancy, antepartum 01/11/2020   History of cerclage, currently pregnant 01/11/2020    Past Surgical History:  Procedure Laterality Date   CERVICAL CERCLAGE  2013    Family History  Problem Relation Age of Onset   Healthy Mother    Hypertension Father     Social History   Socioeconomic History   Marital status: Married    Spouse name: Not on file   Number of children: Not on file   Years of education: Not on file   Highest education level: Not on file  Occupational History   Not on file  Tobacco Use   Smoking status: Never   Smokeless tobacco: Never  Vaping Use   Vaping Use: Never used  Substance and Sexual Activity   Alcohol use: Yes    Comment: occasional   Drug use: No   Sexual activity: Yes    Birth control/protection: None  Other Topics Concern   Not on file  Social History Narrative   Not on file   Social Determinants of Health   Financial Resource Strain: Not on file  Food Insecurity: Not on file  Transportation Needs: Not on file  Physical Activity: Not on file  Stress: Not on file  Social Connections: Not on file  Intimate Partner Violence: Not on file    Outpatient Medications Prior to Visit  Medication Sig Dispense Refill   ondansetron (ZOFRAN ODT) 4 MG disintegrating tablet Take 1 tablet (4 mg total) by mouth every 6 (six) hours as needed for nausea. (Patient not taking: Reported on 04/25/2023) 20 tablet 2   No facility-administered medications prior to visit.      ROS:  Review of Systems  Constitutional: Negative.   Respiratory:  Negative.    Cardiovascular: Negative.   Genitourinary:  Negative for vaginal bleeding, vaginal discharge and vaginal pain.       Condom came off during IC ~3d ago, has not been able to remove from vagina      OBJECTIVE:   Vitals:  BP 124/85   Pulse 72   Wt 149 lb 1.6 oz (67.6 kg)   BMI 27.27 kg/m   Physical Exam Constitutional:      Appearance: Normal appearance.  Cardiovascular:     Rate and Rhythm: Normal rate.  Pulmonary:     Effort: Pulmonary effort is normal.  Genitourinary:    Comments: Speculum placed, condom visualized & easily removed from anterior vagina with forceps. Physiologic discharge in vaginal vault without odor or bleeding. Skin:    General: Skin is warm and dry.  Neurological:     General: No focal deficit present.     Mental Status: She is alert and oriented to person, place, and time.     Results: No results found for this or any previous visit (from the past 24 hour(s)).   Assessment/Plan: Encounter for initial prescription of other contraceptives - Plan: Lactic Ac-Citric Ac-Pot Bitart (PHEXXI) 1.8-1-0.4 % GEL  Retained foreign body of vagina, initial encounter    Meds ordered this encounter  Medications   Lactic Ac-Citric  Ac-Pot Bitart (PHEXXI) 1.8-1-0.4 % GEL    Sig: Place 1 each vaginally as needed (Up to 1 hour before intercourse).    Dispense:  60 g    Refill:  11      No follow-ups on file.  Dominica Severin, CNM 04/26/2023 11:15 AM

## 2023-05-17 ENCOUNTER — Encounter (HOSPITAL_COMMUNITY): Payer: Self-pay

## 2023-05-17 ENCOUNTER — Ambulatory Visit (INDEPENDENT_AMBULATORY_CARE_PROVIDER_SITE_OTHER): Payer: 59

## 2023-05-17 ENCOUNTER — Ambulatory Visit (HOSPITAL_COMMUNITY)
Admission: RE | Admit: 2023-05-17 | Discharge: 2023-05-17 | Disposition: A | Discharge status: Home / Self Care | Payer: 59 | Source: Ambulatory Visit | Attending: Physician Assistant | Admitting: Physician Assistant

## 2023-05-17 VITALS — BP 131/82 | HR 78 | Temp 99.4°F | Resp 18

## 2023-05-17 DIAGNOSIS — S335XXA Sprain of ligaments of lumbar spine, initial encounter: Secondary | ICD-10-CM

## 2023-05-17 DIAGNOSIS — M546 Pain in thoracic spine: Secondary | ICD-10-CM

## 2023-05-17 DIAGNOSIS — S069X0A Unspecified intracranial injury without loss of consciousness, initial encounter: Secondary | ICD-10-CM

## 2023-05-17 DIAGNOSIS — O219 Vomiting of pregnancy, unspecified: Secondary | ICD-10-CM

## 2023-05-17 MED ORDER — ONDANSETRON 4 MG PO TBDP
4.0000 mg | ORAL_TABLET | Freq: Once | ORAL | Status: AC
  Administered 2023-05-17: 4 mg via ORAL

## 2023-05-17 MED ORDER — ONDANSETRON 4 MG PO TBDP
4.0000 mg | ORAL_TABLET | Freq: Four times a day (QID) | ORAL | 0 refills | Status: DC | PRN
Start: 2023-05-17 — End: 2023-10-18

## 2023-05-17 MED ORDER — NAPROXEN 375 MG PO TABS: 375.0000 mg | ORAL_TABLET | Freq: Two times a day (BID) | ORAL | 0 refills | Status: DC

## 2023-05-17 MED ORDER — ONDANSETRON 4 MG PO TBDP
ORAL_TABLET | ORAL | Status: AC
  Filled 2023-05-17: qty 1

## 2023-05-17 MED ORDER — METHOCARBAMOL 500 MG PO TABS: 500.0000 mg | ORAL_TABLET | Freq: Two times a day (BID) | ORAL | 0 refills | Status: DC
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
## 2023-05-17 NOTE — Discharge Instructions (Signed)
I believe that you had a mild concussion.  Use the Zofran to help manage her nausea.  Eat small frequent meals and drink plenty of fluid.  Avoid strenuous activity.  Use Naprosyn for pain relief.  Do not take NSAIDs with this medication including aspirin, ibuprofen/Advil, naproxen/Aleve.  You can use methocarbamol.  This make you sleepy so do not drive or drink alcohol while taking it.  Use heat, rest, stretch for additional symptom relief.  I would like her to follow-up with sports medicine for further evaluation and management.  Call them to schedule an appointment.  If you have any worsening symptoms including severe headache, increasing pain, weakness or numbness/tingling in your hands or feet, nausea, vomiting, feeling very sleepy you need to go to the emergency room.
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
## 2023-09-07 ENCOUNTER — Telehealth: Payer: No Typology Code available for payment source

## 2023-09-07 ENCOUNTER — Telehealth: Payer: No Typology Code available for payment source | Admitting: Nurse Practitioner

## 2023-09-07 DIAGNOSIS — J40 Bronchitis, not specified as acute or chronic: Secondary | ICD-10-CM | POA: Diagnosis not present

## 2023-09-07 MED ORDER — AZITHROMYCIN 250 MG PO TABS
ORAL_TABLET | ORAL | 0 refills | Status: AC
Start: 2023-09-07 — End: 2023-09-12

## 2023-09-07 NOTE — Progress Notes (Signed)
Virtual Visit Consent   [name], you are scheduled for a virtual visit with a [facility] provider today. Just as with appointments in the office, your consent must be obtained to participate. Your consent will be active for this visit and any virtual visit you may have with one of our providers in the next 365 days. If you have a MyChart account, a copy of this consent can be sent to you electronically.  As this is a virtual visit, video technology does not allow for your provider to perform a traditional examination. This may limit your provider's ability to fully assess your condition. If your provider identifies any concerns that need to be evaluated in person or the need to arrange testing (such as labs, EKG, etc.), we will make arrangements to do so. Although advances in technology are sophisticated, we cannot ensure that it will always work on either your end or our end. If the connection with a video visit is poor, the visit may have to be switched to a telephone visit. With either a video or telephone visit, we are not always able to ensure that we have a secure connection.  By engaging in this virtual visit, you consent to the provision of healthcare and authorize for your insurance to be billed (if applicable) for the services provided during this visit. Depending on your insurance coverage, you may receive a charge related to this service.  I need to obtain your verbal consent now. Are you willing to proceed with your visit today? Michele Salinas has provided verbal consent on 09/07/2023 for a virtual visit (video or telephone). Claiborne Rigg, NP  Date: 09/07/2023 12:40 PM  Virtual Visit via Video Note   I, Claiborne Rigg, connected with  Michele Salinas  ([id number], 01-03-1991) on 09/07/23 at 12:30 PM EDT by a video-enabled telemedicine application and verified that I am speaking with the correct person using two identifiers.  Location: Patient: Virtual Visit Location  Patient: Home Provider: Virtual Visit Location Provider: Home Office   I discussed the limitations of evaluation and management by telemedicine and the availability of in person appointments. The patient expressed understanding and agreed to proceed.    History of Present Illness: Michele Salinas is a 32 y.o. who identifies as a female who was assigned female at birth, and is being seen today for URI.  [name] states she has been around sick contacts in her household. Her 34 year old is currently being treated for an ear infection child and her husband is being treated with an antibiotic for URI.  She is experiencing sore throat and cough over the past few days which seems to be worsening. Cough is productive.    Problems:  Patient Active Problem List   Diagnosis Date Noted   Supervision of high risk pregnancy, antepartum 01/11/2020   Supervision of other normal pregnancy, antepartum 01/11/2020   Cervical insufficiency during pregnancy, antepartum 01/11/2020   History of cerclage, currently pregnant 01/11/2020    Allergies: No Known Allergies Medications:  Current Outpatient Medications:    azithromycin (ZITHROMAX) 250 MG tablet, Take 2 tablets on day 1, then 1 tablet daily on days 2 through 5, Disp: 6 tablet, Rfl: 0   Lactic Ac-Citric Ac-Pot Bitart (PHEXXI) 1.8-1-0.4 % GEL, Place 1 each vaginally as needed (Up to 1 hour before intercourse)., Disp: 60 g, Rfl: 11   methocarbamol (ROBAXIN) 500 MG tablet, Take 1 tablet (500 mg total) by mouth 2 (two) times daily.,  Disp: 20 tablet, Rfl: 0   naproxen (NAPROSYN) 375 MG tablet, Take 1 tablet (375 mg total) by mouth 2 (two) times daily., Disp: 20 tablet, Rfl: 0   ondansetron (ZOFRAN ODT) 4 MG disintegrating tablet, Take 1 tablet (4 mg total) by mouth every 6 (six) hours as needed for nausea., Disp: 20 tablet, Rfl: 0  Observations/Objective: Patient is well-developed, well-nourished in no acute distress.  Resting comfortably at home.   Head is normocephalic, atraumatic.  No labored breathing.  Speech is clear and coherent with logical content.  Patient is alert and oriented at baseline.    Assessment and Plan: 1. Bronchitis - azithromycin (ZITHROMAX) 250 MG tablet; Take 2 tablets on day 1, then 1 tablet daily on days 2 through 5  Dispense: 6 tablet; Refill: 0  INSTRUCTIONS: use a humidifier for nasal congestion Drink plenty of fluids, rest and wash hands frequently to avoid the spread of infection Alternate tylenol and Motrin for relief of fever   Follow Up Instructions: I discussed the assessment and treatment plan with the patient. The patient was provided an opportunity to ask questions and all were answered. The patient agreed with the plan and demonstrated an understanding of the instructions.  A copy of instructions were sent to the patient via MyChart unless otherwise noted below.   The patient was advised to call back or seek an in-person evaluation if the symptoms worsen or if the condition fails to improve as anticipated.  Time:  I spent 12 minutes with the patient via telehealth technology discussing the above problems/concerns.    Claiborne Rigg, NP

## 2023-09-07 NOTE — Patient Instructions (Signed)
  [name], thank you for joining Claiborne Rigg, NP for today's virtual visit.  While this provider is not your primary care provider (PCP), if your PCP is located in our provider database this encounter information will be shared with them immediately following your visit.   A [facility] MyChart account gives you access to today's visit and all your visits, tests, and labs performed at [facility] " click here if you don't have a [facility] MyChart account or go to [email or web address]  Consent: (Patient) [name] provided verbal consent for this virtual visit at the beginning of the encounter.  Current Medications:  Current Outpatient Medications:    azithromycin (ZITHROMAX) 250 MG tablet, Take 2 tablets on day 1, then 1 tablet daily on days 2 through 5, Disp: 6 tablet, Rfl: 0   Lactic Ac-Citric Ac-Pot Bitart (PHEXXI) 1.8-1-0.4 % GEL, Place 1 each vaginally as needed (Up to 1 hour before intercourse)., Disp: 60 g, Rfl: 11   methocarbamol (ROBAXIN) 500 MG tablet, Take 1 tablet (500 mg total) by mouth 2 (two) times daily., Disp: 20 tablet, Rfl: 0   naproxen (NAPROSYN) 375 MG tablet, Take 1 tablet (375 mg total) by mouth 2 (two) times daily., Disp: 20 tablet, Rfl: 0   ondansetron (ZOFRAN ODT) 4 MG disintegrating tablet, Take 1 tablet (4 mg total) by mouth every 6 (six) hours as needed for nausea., Disp: 20 tablet, Rfl: 0   Medications ordered in this encounter:  Meds ordered this encounter  Medications   azithromycin (ZITHROMAX) 250 MG tablet    Sig: Take 2 tablets on day 1, then 1 tablet daily on days 2 through 5    Dispense:  6 tablet    Refill:  0    Order Specific Question:   Supervising Provider    Answer:   Merrilee Jansky [id number]     *If you need refills on other medications prior to your next appointment, please contact your pharmacy*  Follow-Up: Call back or seek an in-person evaluation if the symptoms worsen or if the condition fails  to improve as anticipated.  [facility] [phone]  Other Instructions INSTRUCTIONS: use a humidifier for nasal congestion Drink plenty of fluids, rest and wash hands frequently to avoid the spread of infection Alternate tylenol and Motrin for relief of fever    If you have been instructed to have an in-person evaluation today at a local Urgent Care facility, please use the link below. It will take you to a list of all of our available [facility], including address, phone number and hours of operation. Please do not delay care.  [facility]  If you or a family member do not have a primary care provider, use the link below to schedule a visit and establish care. When you choose a [facility] primary care physician or advanced practice provider, you gain a long-term partner in health. Find a Primary Care Provider  Learn more about [facility]'s in-office and virtual care options: [facility] - Get Care Now

## 2023-09-08 ENCOUNTER — Telehealth: Payer: No Typology Code available for payment source

## 2023-09-25 ENCOUNTER — Encounter (INDEPENDENT_AMBULATORY_CARE_PROVIDER_SITE_OTHER): Payer: No Typology Code available for payment source | Admitting: Ophthalmology

## 2023-10-01 ENCOUNTER — Other Ambulatory Visit: Payer: Self-pay

## 2023-10-01 ENCOUNTER — Ambulatory Visit
Admission: RE | Admit: 2023-10-01 | Discharge: 2023-10-01 | Disposition: A | Discharge status: Home / Self Care | Payer: No Typology Code available for payment source | Source: Ambulatory Visit | Attending: Internal Medicine | Admitting: Internal Medicine

## 2023-10-01 VITALS — BP 117/85 | HR 75 | Temp 98.9°F | Resp 16

## 2023-10-01 DIAGNOSIS — Z113 Encounter for screening for infections with a predominantly sexual mode of transmission: Secondary | ICD-10-CM | POA: Diagnosis not present

## 2023-10-01 DIAGNOSIS — N898 Other specified noninflammatory disorders of vagina: Secondary | ICD-10-CM | POA: Insufficient documentation

## 2023-10-01 NOTE — Discharge Instructions (Signed)
Vaginal swab is pending.  We will call when it results and send any appropriate treatment.

## 2023-10-01 NOTE — ED Provider Notes (Signed)
[facility]    CSN: [id number] Arrival date & time: 10/01/23  0857      History   Chief Complaint Chief Complaint  Patient presents with   Urinary Frequency    Actual reason that's not listed: Possible Bacteria infection - Entered by patient    HPI Michele Salinas is a 32 y.o. female.   Patient presents with vaginal odor that started a few days ago.  Denies vaginal discharge, dysuria, urinary frequency, hematuria, abnormal vaginal bleeding, abdominal pain, back pain, fever.  Denies exposure to STD but has had unprotected sexual intercourse with her husband.  Last menstrual cycle was at the beginning of the month.   Urinary Frequency    History reviewed. No pertinent past medical history.  Patient Active Problem List   Diagnosis Date Noted   Supervision of high risk pregnancy, antepartum 01/11/2020   Supervision of other normal pregnancy, antepartum 01/11/2020   Cervical insufficiency during pregnancy, antepartum 01/11/2020   History of cerclage, currently pregnant 01/11/2020    Past Surgical History:  Procedure Laterality Date   CERVICAL CERCLAGE  2013    OB History     Gravida  2   Para  1   Term  1   Preterm      AB      Living  1      SAB      IAB      Ectopic      Multiple      Live Births  1            Home Medications    Prior to Admission medications   Medication Sig Start Date End Date Taking? Authorizing Provider  Lactic Ac-Citric Ac-Pot Bitart (PHEXXI) 1.8-1-0.4 % GEL Place 1 each vaginally as needed (Up to 1 hour before intercourse). 04/26/23   Dominica Severin, CNM  methocarbamol (ROBAXIN) 500 MG tablet Take 1 tablet (500 mg total) by mouth 2 (two) times daily. 05/17/23   Raspet, Noberto Retort, PA-C  naproxen (NAPROSYN) 375 MG tablet Take 1 tablet (375 mg total) by mouth 2 (two) times daily. 05/17/23   Raspet, Erin K, PA-C  ondansetron (ZOFRAN ODT) 4 MG disintegrating tablet Take 1 tablet (4 mg total) by mouth  every 6 (six) hours as needed for nausea. 05/17/23   Raspet, Noberto Retort, PA-C    Family History Family History  Problem Relation Age of Onset   Healthy Mother    Hypertension Father     Social History Social History   Tobacco Use   Smoking status: Never   Smokeless tobacco: Never  Vaping Use   Vaping status: Never Used  Substance Use Topics   Alcohol use: Yes    Comment: occasional   Drug use: No     Allergies   Patient has no known allergies.   Review of Systems Review of Systems Per HPI  Physical Exam Triage Vital Signs ED Triage Vitals  Encounter Vitals Group     BP 10/01/23 0930 117/85     Systolic BP Percentile --      Diastolic BP Percentile --      Pulse Rate 10/01/23 0930 75     Resp 10/01/23 0930 16     Temp 10/01/23 0930 98.9 F (37.2 C)     Temp Source 10/01/23 0930 Oral     SpO2 10/01/23 0930 98 %     Weight --      Height --  Head Circumference --      Peak Flow --      Pain Score 10/01/23 0931 0     Pain Loc --      Pain Education --      Exclude from Growth Chart --    No data found.  Updated Vital Signs BP 117/85 (BP Location: Left Arm)   Pulse 75   Temp 98.9 F (37.2 C) (Oral)   Resp 16   LMP 09/19/2023 (Approximate)   SpO2 98%   Visual Acuity Right Eye Distance:   Left Eye Distance:   Bilateral Distance:    Right Eye Near:   Left Eye Near:    Bilateral Near:     Physical Exam Constitutional:      General: She is not in acute distress.    Appearance: Normal appearance. She is not toxic-appearing or diaphoretic.  HENT:     Head: Normocephalic and atraumatic.  Eyes:     Extraocular Movements: Extraocular movements intact.     Conjunctiva/sclera: Conjunctivae normal.  Pulmonary:     Effort: Pulmonary effort is normal.  Genitourinary:    Comments: Deferred with shared decision making.  Self swab performed. Neurological:     General: No focal deficit present.     Mental Status: She is alert and oriented to person,  place, and time. Mental status is at baseline.  Psychiatric:        Mood and Affect: Mood normal.        Behavior: Behavior normal.        Thought Content: Thought content normal.        Judgment: Judgment normal.      UC Treatments / Results  Labs (all labs ordered are listed, but only abnormal results are displayed) Labs Reviewed  CERVICOVAGINAL ANCILLARY ONLY    EKG   Radiology No results found.  Procedures Procedures (including critical care time)  Medications Ordered in UC Medications - No data to display  Initial Impression / Assessment and Plan / UC Course  I have reviewed the triage vital signs and the nursing notes.  Pertinent labs & imaging results that were available during my care of the patient were reviewed by me and considered in my medical decision making (see chart for details).     Cervicovaginal swab pending.  Patient would like STD testing included in vaginal swab.  Given no confirmed exposure to STD, will await result prior to any treatment.  Advised strict follow-up precautions.  Patient verbalized understanding and was agreeable with plan. Final Clinical Impressions(s) / UC Diagnoses   Final diagnoses:  Vaginal odor  Screening examination for venereal disease     Discharge Instructions      Vaginal swab is pending.  We will call when it results and send any appropriate treatment.    ED Prescriptions   None    PDMP not reviewed this encounter.   Gustavus Bryant, [place] 10/01/23 [phone]

## 2023-10-01 NOTE — ED Triage Notes (Signed)
Pt presents to the office for vaginal discharge. Pt reports a fishy-odor and think it may be BV.

## 2023-10-02 LAB — CERVICOVAGINAL ANCILLARY ONLY
Bacterial Vaginitis (gardnerella): NEGATIVE
Candida Glabrata: NEGATIVE
Candida Vaginitis: POSITIVE — AB
Chlamydia: NEGATIVE
Comment: NEGATIVE
Comment: NEGATIVE
Comment: NEGATIVE
Comment: NEGATIVE
Comment: NEGATIVE
Comment: NORMAL
Neisseria Gonorrhea: NEGATIVE
Trichomonas: NEGATIVE

## 2023-10-03 ENCOUNTER — Telehealth: Payer: Self-pay

## 2023-10-03 MED ORDER — FLUCONAZOLE 150 MG PO TABS: 150.0000 mg | ORAL_TABLET | Freq: Once | ORAL | 0 refills | Status: AC

## 2023-10-03 NOTE — Telephone Encounter (Signed)
Per protocol, pt requires tx with Diflucan.  Reviewed with patient, verified pharmacy, prescription sent  Pt would also like it noted in her chart, that she denied "fishy smell," but that it was more a "musty smell." She states this was on her AVS.

## 2023-10-11 ENCOUNTER — Telehealth: Payer: No Typology Code available for payment source | Admitting: Physician Assistant

## 2023-10-11 DIAGNOSIS — G43811 Other migraine, intractable, with status migrainosus: Secondary | ICD-10-CM

## 2023-10-11 DIAGNOSIS — G44209 Tension-type headache, unspecified, not intractable: Secondary | ICD-10-CM | POA: Diagnosis not present

## 2023-10-11 MED ORDER — PREDNISONE 10 MG (21) PO TBPK
ORAL_TABLET | ORAL | 0 refills | Status: DC
Start: 2023-10-11 — End: 2023-10-18

## 2023-10-11 MED ORDER — TIZANIDINE HCL 2 MG PO CAPS: 2.0000 mg | ORAL_CAPSULE | Freq: Three times a day (TID) | ORAL | 0 refills | Status: DC | PRN

## 2023-10-11 MED ORDER — SUMATRIPTAN SUCCINATE 50 MG PO TABS: 50.0000 mg | ORAL_TABLET | ORAL | 0 refills | Status: DC | PRN

## 2023-10-11 NOTE — Patient Instructions (Signed)
Michele Salinas, thank you for joining Margaretann Loveless, PA-C for today's virtual visit.  While this provider is not your primary care provider (PCP), if your PCP is located in our provider database this encounter information will be shared with them immediately following your visit.   A [facility] MyChart account gives you access to today's visit and all your visits, tests, and labs performed at [facility] " click here if you don't have a [facility] MyChart account or go to [email or web address]  Consent: (Patient) Michele Salinas provided verbal consent for this virtual visit at the beginning of the encounter.  Current Medications:  Current Outpatient Medications:    predniSONE (STERAPRED UNI-PAK 21 TAB) 10 MG (21) TBPK tablet, 6 day taper; take as directed on package instructions, Disp: 21 tablet, Rfl: 0   SUMAtriptan (IMITREX) 50 MG tablet, Take 1 tablet (50 mg total) by mouth every 2 (two) hours as needed for migraine. May repeat in 2 hours if headache persists or recurs., Disp: 10 tablet, Rfl: 0   tizanidine (ZANAFLEX) 2 MG capsule, Take 1 capsule (2 mg total) by mouth 3 (three) times daily as needed for muscle spasms., Disp: 30 capsule, Rfl: 0   Lactic Ac-Citric Ac-Pot Bitart (PHEXXI) 1.8-1-0.4 % GEL, Place 1 each vaginally as needed (Up to 1 hour before intercourse)., Disp: 60 g, Rfl: 11   methocarbamol (ROBAXIN) 500 MG tablet, Take 1 tablet (500 mg total) by mouth 2 (two) times daily., Disp: 20 tablet, Rfl: 0   naproxen (NAPROSYN) 375 MG tablet, Take 1 tablet (375 mg total) by mouth 2 (two) times daily., Disp: 20 tablet, Rfl: 0   ondansetron (ZOFRAN ODT) 4 MG disintegrating tablet, Take 1 tablet (4 mg total) by mouth every 6 (six) hours as needed for nausea., Disp: 20 tablet, Rfl: 0   Medications ordered in this encounter:  Meds ordered this encounter  Medications   predniSONE (STERAPRED UNI-PAK 21 TAB) 10 MG (21) TBPK tablet    Sig: 6 day taper; take as  directed on package instructions    Dispense:  21 tablet    Refill:  0    Order Specific Question:   Supervising Provider    Answer:   Merrilee Jansky [[id number]]   SUMAtriptan (IMITREX) 50 MG tablet    Sig: Take 1 tablet (50 mg total) by mouth every 2 (two) hours as needed for migraine. May repeat in 2 hours if headache persists or recurs.    Dispense:  10 tablet    Refill:  0    Order Specific Question:   Supervising Provider    Answer:   Merrilee Jansky [[id number]]   tizanidine (ZANAFLEX) 2 MG capsule    Sig: Take 1 capsule (2 mg total) by mouth 3 (three) times daily as needed for muscle spasms.    Dispense:  30 capsule    Refill:  0    Order Specific Question:   Supervising Provider    Answer:   Merrilee Jansky [id number]     *If you need refills on other medications prior to your next appointment, please contact your pharmacy*  Follow-Up: Call back or seek an in-person evaluation if the symptoms worsen or if the condition fails to improve as anticipated.  [facility] [phone]  Other Instructions Migraine Headache A migraine headache is an intense pulsing or throbbing pain on one or both sides of the head. Migraine headaches may also cause other symptoms, such as nausea, vomiting, and  sensitivity to light and noise. A migraine headache can last from 4 hours to 3 days. Talk with your health care provider about what things may bring on (trigger) your migraine headaches. What are the causes? The exact cause is not known. However, a migraine may be caused when nerves in the brain get irritated and release chemicals that cause blood vessels to become inflamed. This inflammation causes pain. Migraines may be triggered or caused by: Smoking. Medicines, such as: Nitroglycerin, which is used to treat chest pain. Birth control pills. Estrogen. Certain blood pressure medicines. Foods or drinks that contain nitrates, glutamate, aspartame, MSG, or tyramine. Certain  foods or drinks, such as aged cheeses, chocolate, alcohol, or caffeine. Doing physical activity that is very hard. Other triggers may include: Menstruation. Pregnancy. Hunger. Stress. Getting too much or too little sleep. Weather changes. Tiredness (fatigue). What increases the risk? The following factors may make you more likely to have migraine headaches: Being between the ages of 26-58 years old. Being female. Having a family history of migraine headaches. Being Caucasian. Having a mental health condition, such as depression or anxiety. Being obese. What are the signs or symptoms? The main symptom of this condition is pulsing or throbbing pain. This pain may: Happen in any area of the head, such as on one or both sides. Make it hard to do daily activities. Get worse with physical activity. Get worse around bright lights, loud noises, or smells. Other symptoms may include: Nausea. Vomiting. Dizziness. Before a migraine headache starts, you may get warning signs (an aura). An aura may include: Seeing flashing lights or having blind spots. Seeing bright spots, halos, or zigzag lines. Having tunnel vision or blurred vision. Having numbness or a tingling feeling. Having trouble talking. Having muscle weakness. After a migraine ends, you may have symptoms. These may include: Feeling tired. Trouble concentrating. How is this diagnosed? A migraine headache can be diagnosed based on: Your symptoms. A physical exam. Tests, such as: A CT scan or an MRI of the head. These tests can help rule out other causes of headaches. Taking fluid from the spine (lumbar puncture) to examine it (cerebrospinal fluid analysis, or CSF analysis). How is this treated? This condition may be treated with medicines that: Relieve pain and nausea. Prevent migraines. Treatment may also include: Acupuncture. Lifestyle changes like avoiding foods that trigger migraine headaches. Learning ways to  control your body (biofeedback). Talk therapy to help you know and deal with negative thoughts (cognitive behavioral therapy). Follow these instructions at home: Medicines Take over-the-counter and prescription medicines only as told by your provider. Ask your provider if the medicine prescribed to you: Requires you to avoid driving or using machinery. Can cause constipation. You may need to take these actions to prevent or treat constipation: Drink enough fluid to keep your pee (urine) pale yellow. Take over-the-counter or prescription medicines. Eat foods that are high in fiber, such as beans, whole grains, and fresh fruits and vegetables. Limit foods that are high in fat and processed sugars, such as fried or sweet foods. Lifestyle  Do not drink alcohol. Do not use any products that contain nicotine or tobacco. These products include cigarettes, chewing tobacco, and vaping devices, such as e-cigarettes. If you need help quitting, ask your provider. Get 7-9 hours of sleep each night, or the amount recommended by your provider. Find ways to manage stress, such as meditation, deep breathing, or yoga. Try to exercise regularly. This can help lessen how bad and how often your  migraines occur. General instructions Keep a journal to find out what triggers your migraines, so you can avoid those things. For example, write down: What you eat and drink. How much sleep you get. Any change to your diet or medicines. If you have a migraine headache: Avoid things that make your symptoms worse, such as bright lights. Lie down in a dark, quiet room. Do not drive or use machinery. Ask your provider what activities are safe for you while you have symptoms. Keep all follow-up visits. Your provider will monitor your symptoms and recommend any further treatment. Where to find more information Coalition for Headache and Migraine Patients (CHAMP): headachemigraine.org American Migraine Foundation:  americanmigrainefoundation.org National Headache Foundation: headaches.org Contact a health care provider if: You have symptoms that are different or worse than your usual migraine headache symptoms. You have more than 15 days of headaches in one month. Get help right away if: Your migraine headache becomes severe or lasts more than 72 hours. You have a fever or stiff neck. You have vision loss. Your muscles feel weak or like you cannot control them. You lose your balance often or have trouble walking. You faint. You have a seizure. This information is not intended to replace advice given to you by your health care provider. Make sure you discuss any questions you have with your health care provider. Document Revised: 07/16/2022 Document Reviewed: 07/16/2022 Elsevier Patient Education  2024 Elsevier Inc.    If you have been instructed to have an in-person evaluation today at a local Urgent Care facility, please use the link below. It will take you to a list of all of our available [facility], including address, phone number and hours of operation. Please do not delay care.  [facility]  If you or a family member do not have a primary care provider, use the link below to schedule a visit and establish care. When you choose a [facility] primary care physician or advanced practice provider, you gain a long-term partner in health. Find a Primary Care Provider  Learn more about [facility]'s in-office and virtual care options: [facility] - Get Care Now

## 2023-10-11 NOTE — Progress Notes (Signed)
Virtual Visit Consent   Michele Salinas, you are scheduled for a virtual visit with a [facility] provider today. Just as with appointments in the office, your consent must be obtained to participate. Your consent will be active for this visit and any virtual visit you may have with one of our providers in the next 365 days. If you have a MyChart account, a copy of this consent can be sent to you electronically.  As this is a virtual visit, video technology does not allow for your provider to perform a traditional examination. This may limit your provider's ability to fully assess your condition. If your provider identifies any concerns that need to be evaluated in person or the need to arrange testing (such as labs, EKG, etc.), we will make arrangements to do so. Although advances in technology are sophisticated, we cannot ensure that it will always work on either your end or our end. If the connection with a video visit is poor, the visit may have to be switched to a telephone visit. With either a video or telephone visit, we are not always able to ensure that we have a secure connection.  By engaging in this virtual visit, you consent to the provision of healthcare and authorize for your insurance to be billed (if applicable) for the services provided during this visit. Depending on your insurance coverage, you may receive a charge related to this service.  I need to obtain your verbal consent now. Are you willing to proceed with your visit today? [name] has provided verbal consent on 10/11/2023 for a virtual visit (video or telephone). Margaretann Loveless, PA-C  Date: 10/11/2023 12:35 PM  Virtual Visit via Video Note   I, Margaretann Loveless, connected with  Michele Salinas  ([id number], 1991/02/24) on 10/11/23 at 12:15 PM EST by a video-enabled telemedicine application and verified that I am speaking with the correct person using two identifiers.  Location: Patient: Virtual  Visit Location Patient: Home Provider: Virtual Visit Location Provider: Home Office   I discussed the limitations of evaluation and management by telemedicine and the availability of in person appointments. The patient expressed understanding and agreed to proceed.    History of Present Illness: Michele Salinas is a 32 y.o. who identifies as a female who was assigned female at birth, and is being seen today for migraine/headache.  HPI: Migraine  This is a new problem. The current episode started in the past 7 days (3 days). The problem occurs intermittently. The problem has been unchanged. The pain is located in the Left unilateral region. The pain radiates to the left neck. The pain quality is similar to prior headaches. The quality of the pain is described as pulsating, throbbing, sharp and dull. The pain is moderate. Associated symptoms include a loss of balance (mild feelings of off balance or like she is on a boat) and phonophobia. Pertinent negatives include no blurred vision, dizziness, ear pain, eye pain, eye redness, eye watering, fever, hearing loss, nausea, neck pain, numbness, photophobia, scalp tenderness, seizures, sinus pressure, tingling, tinnitus, visual change, vomiting or weakness. Associated symptoms comments: Fatigue. The symptoms are aggravated by work and fatigue. She has tried NSAIDs for the symptoms. The treatment provided no relief. Her past medical history is significant for migraine headaches.     Problems:  Patient Active Problem List   Diagnosis Date Noted   Supervision of high risk pregnancy, antepartum 01/11/2020   Supervision of other normal pregnancy, antepartum 01/11/2020  Cervical insufficiency during pregnancy, antepartum 01/11/2020   History of cerclage, currently pregnant 01/11/2020    Allergies: No Known Allergies Medications:  Current Outpatient Medications:    predniSONE (STERAPRED UNI-PAK 21 TAB) 10 MG (21) TBPK tablet, 6 day taper; take as  directed on package instructions, Disp: 21 tablet, Rfl: 0   SUMAtriptan (IMITREX) 50 MG tablet, Take 1 tablet (50 mg total) by mouth every 2 (two) hours as needed for migraine. May repeat in 2 hours if headache persists or recurs., Disp: 10 tablet, Rfl: 0   tizanidine (ZANAFLEX) 2 MG capsule, Take 1 capsule (2 mg total) by mouth 3 (three) times daily as needed for muscle spasms., Disp: 30 capsule, Rfl: 0   Lactic Ac-Citric Ac-Pot Bitart (PHEXXI) 1.8-1-0.4 % GEL, Place 1 each vaginally as needed (Up to 1 hour before intercourse)., Disp: 60 g, Rfl: 11   methocarbamol (ROBAXIN) 500 MG tablet, Take 1 tablet (500 mg total) by mouth 2 (two) times daily., Disp: 20 tablet, Rfl: 0   naproxen (NAPROSYN) 375 MG tablet, Take 1 tablet (375 mg total) by mouth 2 (two) times daily., Disp: 20 tablet, Rfl: 0   ondansetron (ZOFRAN ODT) 4 MG disintegrating tablet, Take 1 tablet (4 mg total) by mouth every 6 (six) hours as needed for nausea., Disp: 20 tablet, Rfl: 0  Observations/Objective: Patient is well-developed, well-nourished in no acute distress.  Resting comfortably at home.  Head is normocephalic, atraumatic.  No labored breathing.  Speech is clear and coherent with logical content.  Patient is alert and oriented at baseline.    Assessment and Plan: 1. Other migraine with status migrainosus, intractable - predniSONE (STERAPRED UNI-PAK 21 TAB) 10 MG (21) TBPK tablet; 6 day taper; take as directed on package instructions  Dispense: 21 tablet; Refill: 0 - SUMAtriptan (IMITREX) 50 MG tablet; Take 1 tablet (50 mg total) by mouth every 2 (two) hours as needed for migraine. May repeat in 2 hours if headache persists or recurs.  Dispense: 10 tablet; Refill: 0 - tizanidine (ZANAFLEX) 2 MG capsule; Take 1 capsule (2 mg total) by mouth 3 (three) times daily as needed for muscle spasms.  Dispense: 30 capsule; Refill: 0  2. Tension headache  - Suspect combination type headaches of tension and migraine - Will treat  current migraine with Prednisone since has not resolved in over three days - After completing treatment she can use Imitrex as needed at new onset migraine - Tizanidine added if headache is more squeezing/pulling in nature - Can use both if needed, drowsiness precautions discussed.  - Discussed my migraine buddy app for tracking - Follow up with PCP if having more than 1 migraine per week to discuss preventative options - Seek in person evaluation if this migraine does not resolve or if symptoms worsen  Follow Up Instructions: I discussed the assessment and treatment plan with the patient. The patient was provided an opportunity to ask questions and all were answered. The patient agreed with the plan and demonstrated an understanding of the instructions.  A copy of instructions were sent to the patient via MyChart unless otherwise noted below.    The patient was advised to call back or seek an in-person evaluation if the symptoms worsen or if the condition fails to improve as anticipated.    Margaretann Loveless, PA-C

## 2023-10-13 ENCOUNTER — Encounter: Payer: Self-pay | Admitting: Nurse Practitioner

## 2023-10-16 ENCOUNTER — Encounter: Payer: Self-pay | Admitting: Certified Nurse Midwife

## 2023-10-18 ENCOUNTER — Ambulatory Visit (INDEPENDENT_AMBULATORY_CARE_PROVIDER_SITE_OTHER): Payer: No Typology Code available for payment source | Admitting: Certified Nurse Midwife

## 2023-10-18 ENCOUNTER — Encounter: Payer: Self-pay | Admitting: Certified Nurse Midwife

## 2023-10-18 VITALS — BP 118/82 | HR 85 | Ht 63.0 in | Wt 132.6 lb

## 2023-10-18 DIAGNOSIS — Z3202 Encounter for pregnancy test, result negative: Secondary | ICD-10-CM

## 2023-10-18 DIAGNOSIS — Z3042 Encounter for surveillance of injectable contraceptive: Secondary | ICD-10-CM

## 2023-10-18 DIAGNOSIS — Z3009 Encounter for other general counseling and advice on contraception: Secondary | ICD-10-CM

## 2023-10-18 DIAGNOSIS — Z3200 Encounter for pregnancy test, result unknown: Secondary | ICD-10-CM

## 2023-10-18 LAB — POCT URINE PREGNANCY: Preg Test, Ur: NEGATIVE

## 2023-10-18 MED ORDER — MEDROXYPROGESTERONE ACETATE 150 MG/ML IM SUSP
150.0000 mg | Freq: Once | INTRAMUSCULAR | Status: AC
Start: 2023-10-18 — End: 2023-10-18
  Administered 2023-10-18: 150 mg via INTRAMUSCULAR

## 2023-10-18 NOTE — Progress Notes (Signed)
      Subjective:    Patient ID: Michele Salinas, female    DOB: [birth year], 32 y.o.   MRN: [id number]  HPI  Patient is a 32 y.o. G110P2002 female who presents for depo provera injection. She has been using withdrawal as contraception and desires longer acting option. LMP 10/10/23, last IC 11/11.   Objective:    BP 118/82   Pulse 85   Ht 5\' 3"  (1.6 m)   Wt 132 lb 9.6 oz (60.1 kg)   LMP 10/10/2023 (Approximate)   BMI 23.49 kg/m   Last Annual: due. Last pap: due. Last Depo-Provera: n/a. Side Effects if any: n/a. Serum HCG indicated? Negative UPT. Depo-Provera 150 mg IM given by: Georgiana Shore, CMA. Site: Right Upper Outer [place]and[place]   Assessment:   1. Surveillance for Depo-Provera contraception   2. Possible pregnancy, not yet confirmed   3. Encounter for counseling regarding contraception      Plan:   Next appointment due between 01/03/24 and 01/17/24.  Recommend backup contraceptive, abstinence or condoms for one week, take pregnancy test next month as she is 8d from LMP. Encouraged calcium supplementation.  Dominica Severin, CNM

## 2024-01-10 ENCOUNTER — Ambulatory Visit (INDEPENDENT_AMBULATORY_CARE_PROVIDER_SITE_OTHER): Payer: Medicaid Other

## 2024-01-10 VITALS — BP 138/83 | HR 82 | Wt 129.9 lb

## 2024-01-10 DIAGNOSIS — Z3042 Encounter for surveillance of injectable contraceptive: Secondary | ICD-10-CM | POA: Diagnosis not present

## 2024-01-10 MED ORDER — MEDROXYPROGESTERONE ACETATE 150 MG/ML IM SUSY
150.0000 mg | PREFILLED_SYRINGE | Freq: Once | INTRAMUSCULAR | Status: AC
Start: 2024-01-10 — End: 2024-01-10
  Administered 2024-01-10: 150 mg via INTRAMUSCULAR

## 2024-01-10 NOTE — Progress Notes (Signed)
    NURSE VISIT NOTE  Subjective:    Patient ID: Michele Salinas, female    DOB: [birth year], 33 y.o.   MRN: [id number]  HPI  Patient is a 33 y.o. G34P2002 female who presents for depo provera  injection.   Objective:    BP 138/83 (BP Location: Left Arm, Patient Position: Sitting, Cuff Size: Normal)   Pulse 82   Wt 129 lb 14.4 oz (58.9 kg)   BMI 23.01 kg/m   Last Annual: Due. Last pap: Due. Last Depo-Provera : 10/18/2023. Side Effects if any: none. Serum HCG indicated? No . Depo-Provera  150 mg IM given by: Michele Salinas, CMA. Site: Left Upper [place]er [place]    Assessment:   1. Surveillance for Depo-Provera  contraception      Plan:   Next appointment due between April 24th and May 9th.    Michele Salinas, CMA

## 2024-01-29 ENCOUNTER — Telehealth: Payer: Medicaid Other | Admitting: Family

## 2024-01-29 ENCOUNTER — Telehealth: Payer: Medicaid Other

## 2024-01-29 DIAGNOSIS — R399 Unspecified symptoms and signs involving the genitourinary system: Secondary | ICD-10-CM

## 2024-01-29 MED ORDER — CEPHALEXIN 500 MG PO CAPS: 500.0000 mg | ORAL_CAPSULE | Freq: Two times a day (BID) | ORAL | 0 refills | Status: DC

## 2024-01-29 NOTE — Progress Notes (Signed)
 Virtual Visit Consent   Michele Salinas, you are scheduled for a virtual visit with a [facility] provider today. Just as with appointments in the office, your consent must be obtained to participate. Your consent will be active for this visit and any virtual visit you may have with one of our providers in the next 365 days. If you have a MyChart account, a copy of this consent can be sent to you electronically.  As this is a virtual visit, video technology does not allow for your provider to perform a traditional examination. This may limit your provider's ability to fully assess your condition. If your provider identifies any concerns that need to be evaluated in person or the need to arrange testing (such as labs, EKG, etc.), we will make arrangements to do so. Although advances in technology are sophisticated, we cannot ensure that it will always work on either your end or our end. If the connection with a video visit is poor, the visit may have to be switched to a telephone visit. With either a video or telephone visit, we are not always able to ensure that we have a secure connection.  By engaging in this virtual visit, you consent to the provision of healthcare and authorize for your insurance to be billed (if applicable) for the services provided during this visit. Depending on your insurance coverage, you may receive a charge related to this service.  I need to obtain your verbal consent now. Are you willing to proceed with your visit today? Michele Salinas has provided verbal consent on 01/29/2024 for a virtual visit (video or telephone). [name], FNP  Date: 01/29/2024 5:46 PM   Virtual Visit via Video Note   I, [name], connected with  Michele Salinas  ([id number], 1991-03-01) on 01/29/24 at  5:45 PM EST by a video-enabled telemedicine application and verified that I am speaking with the correct person using two identifiers.  Location: Patient: Virtual Visit Location  Patient: Home Provider: Virtual Visit Location Provider: Home Office   I discussed the limitations of evaluation and management by telemedicine and the availability of in person appointments. The patient expressed understanding and agreed to proceed.    History of Present Illness: Michele Salinas is a 33 y.o. who identifies as a female who was assigned female at birth, and is being seen today for urinary urgency and pressure that started three days ago and has worsen.   HPI: Urinary Frequency  This is a new problem. The current episode started in the past 7 days. The problem occurs every urination. The problem has been gradually worsening. Quality: pressure. The pain is at a severity of 8/10. The pain is mild. There has been no fever. Associated symptoms include frequency, hesitancy and urgency. Pertinent negatives include no flank pain, hematuria, nausea or vomiting. She has tried increased fluids (cranberry) for the symptoms. The treatment provided mild relief.    Problems:  Patient Active Problem List   Diagnosis Date Noted   Supervision of high risk pregnancy, antepartum 01/11/2020   Supervision of other normal pregnancy, antepartum 01/11/2020   Cervical insufficiency during pregnancy, antepartum 01/11/2020   History of cerclage, currently pregnant 01/11/2020    Allergies: No Known Allergies Medications:  Current Outpatient Medications:    cephALEXin (KEFLEX) 500 MG capsule, Take 1 capsule (500 mg total) by mouth 2 (two) times daily., Disp: 14 capsule, Rfl: 0   SUMAtriptan (IMITREX) 50 MG tablet, Take 1 tablet (50 mg total) by mouth  every 2 (two) hours as needed for migraine. May repeat in 2 hours if headache persists or recurs., Disp: 10 tablet, Rfl: 0   tizanidine (ZANAFLEX) 2 MG capsule, Take 1 capsule (2 mg total) by mouth 3 (three) times daily as needed for muscle spasms., Disp: 30 capsule, Rfl: 0  Observations/Objective: Patient is well-developed, well-nourished in no acute  distress.  Resting comfortably  at home.  Head is normocephalic, atraumatic.  No labored breathing.  Speech is clear and coherent with logical content.  Patient is alert and oriented at baseline.    Assessment and Plan: 1. UTI symptoms (Primary) - cephALEXin (KEFLEX) 500 MG capsule; Take 1 capsule (500 mg total) by mouth 2 (two) times daily.  Dispense: 14 capsule; Refill: 0  Force fluids AZO over the counter X2 days Follow up if symptoms worsen or do not improve   Follow Up Instructions: I discussed the assessment and treatment plan with the patient. The patient was provided an opportunity to ask questions and all were answered. The patient agreed with the plan and demonstrated an understanding of the instructions.  A copy of instructions were sent to the patient via MyChart unless otherwise noted below.    The patient was advised to call back or seek an in-person evaluation if the symptoms worsen or if the condition fails to improve as anticipated.    [name], FNP

## 2024-03-27 ENCOUNTER — Telehealth: Payer: Self-pay

## 2024-03-27 ENCOUNTER — Ambulatory Visit

## 2024-03-27 VITALS — BP 126/86 | HR 75 | Ht 63.0 in | Wt 136.2 lb

## 2024-03-27 DIAGNOSIS — Z3042 Encounter for surveillance of injectable contraceptive: Secondary | ICD-10-CM

## 2024-03-27 DIAGNOSIS — R82998 Other abnormal findings in urine: Secondary | ICD-10-CM

## 2024-03-27 DIAGNOSIS — R399 Unspecified symptoms and signs involving the genitourinary system: Secondary | ICD-10-CM

## 2024-03-27 LAB — POCT URINALYSIS DIPSTICK OB
Bilirubin, UA: NEGATIVE
Glucose, UA: NEGATIVE
Ketones, UA: NEGATIVE
Nitrite, UA: POSITIVE
POC,PROTEIN,UA: NEGATIVE
Spec Grav, UA: 1.015 (ref 1.010–1.025)
Urobilinogen, UA: 0.2 U/dL
pH, UA: 6 (ref 5.0–8.0)

## 2024-03-27 MED ORDER — MEDROXYPROGESTERONE ACETATE 150 MG/ML IM SUSP
150.0000 mg | Freq: Once | INTRAMUSCULAR | Status: AC
Start: 2024-03-27 — End: 2024-03-27
  Administered 2024-03-27: 150 mg via INTRAMUSCULAR

## 2024-03-27 MED ORDER — NITROFURANTOIN MONOHYD MACRO 100 MG PO CAPS: 100.0000 mg | ORAL_CAPSULE | Freq: Two times a day (BID) | ORAL | 0 refills | Status: DC

## 2024-03-27 NOTE — Telephone Encounter (Signed)
 Spoke with patient. Rescheduled 5/2 depo appointment til today at 1:35 as Depo/UTI. Depo date range 4/24-5/9.

## 2024-03-27 NOTE — Telephone Encounter (Signed)
 TRIAGE VOICEMIAL: Patient states she is having symptoms of onset UTI. She is having frequent urination and pain when she goes. She is trying to catch it before it gets worse. She has an appointment 5/2 for birth control. Inquiring if she needs to be seen or if a rx can be sent to her pharmacy.

## 2024-03-27 NOTE — Progress Notes (Signed)
    NURSE VISIT NOTE  Subjective:    Patient ID: Michele Salinas, female    DOB: 12-25-1990, 33 y.o.   MRN: [id number]  HPI  Patient is a 33 y.o. G20P2002 female who presents for depo provera  injection. She also has complaints of UTI symptoms.    Objective:    BP 126/86   Pulse 75   Ht 5\' 3"  (1.6 m)   Wt 136 lb 3.2 oz (61.8 kg)   BMI 24.13 kg/m   Last Annual: due. Last pap: due. Last Depo-Provera : 01/09/24. Side Effects if any: n/a. Serum HCG indicated? No . Depo-Provera  150 mg IM given by: Woody Heading, CMA. Site: Right Upper [place]    Assessment:   1. Surveillance for Depo-Provera  contraception   2. UTI symptoms   3. Leukocytes in urine      Plan:   Next appointment due between 06/12/24 and 06/26/24. Macrobid sent in the pharmacy on file.    Vale Garrison, CMA

## 2024-03-31 ENCOUNTER — Encounter: Payer: Self-pay | Admitting: Certified Nurse Midwife

## 2024-03-31 LAB — URINE CULTURE

## 2024-04-01 NOTE — Progress Notes (Signed)
 Pt treated while in office

## 2024-04-03 ENCOUNTER — Ambulatory Visit: Payer: Medicaid Other

## 2024-10-23 ENCOUNTER — Telehealth: Admitting: Family Medicine

## 2024-10-23 DIAGNOSIS — N3000 Acute cystitis without hematuria: Secondary | ICD-10-CM

## 2024-10-23 MED ORDER — SULFAMETHOXAZOLE-TRIMETHOPRIM 800-160 MG PO TABS: 1.0000 | ORAL_TABLET | Freq: Two times a day (BID) | ORAL | 0 refills | Status: AC

## 2024-10-23 NOTE — Progress Notes (Signed)
 Virtual Visit Consent   Michele Salinas, you are scheduled for a virtual visit with a [facility] provider today. Just as with appointments in the office, your consent must be obtained to participate. Your consent will be active for this visit and any virtual visit you may have with one of our providers in the next 365 days. If you have a MyChart account, a copy of this consent can be sent to you electronically.  As this is a virtual visit, video technology does not allow for your provider to perform a traditional examination. This may limit your provider's ability to fully assess your condition. If your provider identifies any concerns that need to be evaluated in person or the need to arrange testing (such as labs, EKG, etc.), we will make arrangements to do so. Although advances in technology are sophisticated, we cannot ensure that it will always work on either your end or our end. If the connection with a video visit is poor, the visit may have to be switched to a telephone visit. With either a video or telephone visit, we are not always able to ensure that we have a secure connection.  By engaging in this virtual visit, you consent to the provision of healthcare and authorize for your insurance to be billed (if applicable) for the services provided during this visit. Depending on your insurance coverage, you may receive a charge related to this service.  I need to obtain your verbal consent now. Are you willing to proceed with your visit today? Michele Salinas has provided verbal consent on 10/23/2024 for a virtual visit (video or telephone). Loa Lamp, FNP  Date: 10/23/2024 7:33 PM   Virtual Visit via Video Note   I, Loa Lamp, connected with  Michele Salinas  ([id number], [birth year]) on 10/23/24 at  7:30 PM EST by a video-enabled telemedicine application and verified that I am speaking with the correct person using two identifiers.  Location: Patient: Virtual Visit Location  Patient: Home Provider: Virtual Visit Location Provider: Home Office   I discussed the limitations of evaluation and management by telemedicine and the availability of in person appointments. The patient expressed understanding and agreed to proceed.    History of Present Illness: Michele Salinas is a 33 y.o. who identifies as a female who was assigned female at birth, and is being seen today for heaviness when urinating and frequency starting this am. No fever. Michele Salinas  HPI: HPI  Problems:  Patient Active Problem List   Diagnosis Date Noted   Supervision of high risk pregnancy, antepartum 01/11/2020   Supervision of other normal pregnancy, antepartum 01/11/2020   Cervical insufficiency during pregnancy, antepartum 01/11/2020   History of cerclage, currently pregnant 01/11/2020    Allergies: No Known Allergies Medications:  Current Outpatient Medications:    sulfamethoxazole -trimethoprim  (BACTRIM  DS) 800-160 MG tablet, Take 1 tablet by mouth 2 (two) times daily for 7 days., Disp: 14 tablet, Rfl: 0   cephALEXin  (KEFLEX ) 500 MG capsule, Take 1 capsule (500 mg total) by mouth 2 (two) times daily., Disp: 14 capsule, Rfl: 0   nitrofurantoin , macrocrystal-monohydrate, (MACROBID ) 100 MG capsule, Take 1 capsule (100 mg total) by mouth 2 (two) times daily., Disp: 14 capsule, Rfl: 0   SUMAtriptan  (IMITREX ) 50 MG tablet, Take 1 tablet (50 mg total) by mouth every 2 (two) hours as needed for migraine. May repeat in 2 hours if headache persists or recurs., Disp: 10 tablet, Rfl: 0   tizanidine  (ZANAFLEX ) 2 MG capsule, Take  1 capsule (2 mg total) by mouth 3 (three) times daily as needed for muscle spasms., Disp: 30 capsule, Rfl: 0  Observations/Objective: Patient is well-developed, well-nourished in no acute distress.  Resting comfortably  at home.  Head is normocephalic, atraumatic.  No labored breathing.  Speech is clear and coherent with logical content.  Patient is alert and oriented at baseline.     Assessment and Plan: 1. Acute cystitis without hematuria (Primary)  Increase fluids, prevention discussed, UC if sx worsen.   Follow Up Instructions: I discussed the assessment and treatment plan with the patient. The patient was provided an opportunity to ask questions and all were answered. The patient agreed with the plan and demonstrated an understanding of the instructions.  A copy of instructions were sent to the patient via MyChart unless otherwise noted below.     The patient was advised to call back or seek an in-person evaluation if the symptoms worsen or if the condition fails to improve as anticipated.    Michele Joy, FNP

## 2024-10-23 NOTE — Patient Instructions (Signed)

## 2024-12-17 ENCOUNTER — Ambulatory Visit (INDEPENDENT_AMBULATORY_CARE_PROVIDER_SITE_OTHER): Admitting: Nurse Practitioner

## 2024-12-17 ENCOUNTER — Encounter: Payer: Self-pay | Admitting: Nurse Practitioner

## 2024-12-17 ENCOUNTER — Ambulatory Visit: Payer: Self-pay | Admitting: Nurse Practitioner

## 2024-12-17 VITALS — BP 110/72 | HR 82 | Temp 98.2°F | Ht 63.0 in | Wt 160.1 lb

## 2024-12-17 DIAGNOSIS — F32A Depression, unspecified: Secondary | ICD-10-CM

## 2024-12-17 DIAGNOSIS — E559 Vitamin D deficiency, unspecified: Secondary | ICD-10-CM

## 2024-12-17 DIAGNOSIS — Z131 Encounter for screening for diabetes mellitus: Secondary | ICD-10-CM

## 2024-12-17 DIAGNOSIS — G43809 Other migraine, not intractable, without status migrainosus: Secondary | ICD-10-CM

## 2024-12-17 DIAGNOSIS — R5383 Other fatigue: Secondary | ICD-10-CM

## 2024-12-17 DIAGNOSIS — Z13220 Encounter for screening for lipoid disorders: Secondary | ICD-10-CM

## 2024-12-17 DIAGNOSIS — G43909 Migraine, unspecified, not intractable, without status migrainosus: Secondary | ICD-10-CM | POA: Insufficient documentation

## 2024-12-17 LAB — CBC
HCT: 41.9 % (ref 36.0–46.0)
Hemoglobin: 14.1 g/dL (ref 12.0–15.0)
MCHC: 33.7 g/dL (ref 30.0–36.0)
MCV: 91.7 fl (ref 78.0–100.0)
Platelets: 257 K/uL (ref 150.0–400.0)
RBC: 4.56 Mil/uL (ref 3.87–5.11)
RDW: 13.9 % (ref 11.5–15.5)
WBC: 6.1 K/uL (ref 4.0–10.5)

## 2024-12-17 LAB — LIPID PANEL
Cholesterol: 106 mg/dL (ref 28–200)
HDL: 77.8 mg/dL
LDL Cholesterol: 20 mg/dL (ref 10–99)
NonHDL: 28.15
Total CHOL/HDL Ratio: 1
Triglycerides: 39 mg/dL (ref 10.0–149.0)
VLDL: 7.8 mg/dL (ref 0.0–40.0)

## 2024-12-17 LAB — HEMOGLOBIN A1C: Hgb A1c MFr Bld: 5.3 % (ref 4.6–6.5)

## 2024-12-17 LAB — T3, FREE: T3, Free: 3.2 pg/mL (ref 2.3–4.2)

## 2024-12-17 LAB — COMPREHENSIVE METABOLIC PANEL WITH GFR
ALT: 9 U/L (ref 3–35)
AST: 14 U/L (ref 5–37)
Albumin: 4.5 g/dL (ref 3.5–5.2)
Alkaline Phosphatase: 45 U/L (ref 39–117)
BUN: 10 mg/dL (ref 6–23)
CO2: 31 meq/L (ref 19–32)
Calcium: 9.6 mg/dL (ref 8.4–10.5)
Chloride: 104 meq/L (ref 96–112)
Creatinine, Ser: 0.91 mg/dL (ref 0.40–1.20)
GFR: 81.97 mL/min
Glucose, Bld: 77 mg/dL (ref 70–99)
Potassium: 4.3 meq/L (ref 3.5–5.1)
Sodium: 140 meq/L (ref 135–145)
Total Bilirubin: 0.5 mg/dL (ref 0.2–1.2)
Total Protein: 7.3 g/dL (ref 6.0–8.3)

## 2024-12-17 LAB — VITAMIN B12: Vitamin B-12: 309 pg/mL (ref 211–911)

## 2024-12-17 LAB — TSH: TSH: 0.47 u[IU]/mL (ref 0.35–5.50)

## 2024-12-17 LAB — T4, FREE: Free T4: 0.98 ng/dL (ref 0.60–1.60)

## 2024-12-17 LAB — VITAMIN D 25 HYDROXY (VIT D DEFICIENCY, FRACTURES): VITD: 8.84 ng/mL — ABNORMAL LOW (ref 30.00–100.00)

## 2024-12-17 MED ORDER — VITAMIN D (ERGOCALCIFEROL) 1.25 MG (50000 UNIT) PO CAPS: 50000.0000 [IU] | ORAL_CAPSULE | ORAL | 0 refills | Status: AC

## 2024-12-17 NOTE — Progress Notes (Signed)
 "  New Patient Office Visit  Subjective    Patient ID: Michele Salinas, female    DOB: 02-Oct-1991  Age: 34 y.o. MRN: [id number]  CC:  Chief Complaint  Patient presents with   New Patient (Initial Visit)    Establishing care, work related stress affecting overall health, with acne breakout, stress eating, mental health    HPI Michele Salinas presents to establish care Discussed the use of AI scribe software for clinical note transcription with the patient, who gave verbal consent to proceed.  History of Present Illness Michele Salinas is a 34 year old female who presents with work-related stress and migraines.  Occupational and psychosocial stress - Escalating work-related stress since starting a new job in November 2024 - Increasing workload and minimal support from employer - Feels overwhelmed, especially on Sundays in anticipation of the work week - Raised concerns at work without improvement - Attends therapy less frequently than recommended due to cost - In the midst of a divorce, which adds stress but is less problematic than work  Migraine headaches - Migraines occur two to three times per week - Typically triggered by work stress and meetings - Pain severity requires cessation of work and rest - Associated with photophobia - Uses peppermint oil and naps for relief - Prefers to avoid medication; previously tried Imitrex  and discontinued use  Mood and coping strategies - Manages mood with meditation and other non-pharmacologic methods - Reluctance to start medication due to concerns about dependence and side effects - No history of mood-stabilizing medication use - No SI    Outpatient Encounter Medications as of 12/17/2024  Medication Sig   norethindrone (MICRONOR) 0.35 MG tablet Take 1 tablet by mouth daily.   [DISCONTINUED] cephALEXin  (KEFLEX ) 500 MG capsule Take 1 capsule (500 mg total) by mouth 2 (two) times daily.   [DISCONTINUED] nitrofurantoin ,  macrocrystal-monohydrate, (MACROBID ) 100 MG capsule Take 1 capsule (100 mg total) by mouth 2 (two) times daily.   [DISCONTINUED] SUMAtriptan  (IMITREX ) 50 MG tablet Take 1 tablet (50 mg total) by mouth every 2 (two) hours as needed for migraine. May repeat in 2 hours if headache persists or recurs.   [DISCONTINUED] tizanidine  (ZANAFLEX ) 2 MG capsule Take 1 capsule (2 mg total) by mouth 3 (three) times daily as needed for muscle spasms.   No facility-administered encounter medications on file as of 12/17/2024.    No past medical history on file.  Past Surgical History:  Procedure Laterality Date   CERVICAL CERCLAGE  2013    Family History  Problem Relation Age of Onset   Healthy Mother    Hypertension Father     Social History   Socioeconomic History   Marital status: Married    Spouse name: Not on file   Number of children: Not on file   Years of education: Not on file   Highest education level: Not on file  Occupational History   Not on file  Tobacco Use   Smoking status: Never   Smokeless tobacco: Never  Vaping Use   Vaping status: Never Used  Substance and Sexual Activity   Alcohol use: Yes    Comment: occasional   Drug use: No   Sexual activity: Yes    Birth control/protection: None  Other Topics Concern   Not on file  Social History Narrative   Not on file   Social Drivers of Health   Tobacco Use: Low Risk (10/23/2024)   Patient History    Smoking Tobacco  Use: Never    Smokeless Tobacco Use: Never    Passive Exposure: Not on file  Financial Resource Strain: Not on file  Food Insecurity: Not on file  Transportation Needs: Not on file  Physical Activity: Not on file  Stress: Not on file  Social Connections: Not on file  Intimate Partner Violence: Not on file  Depression (PHQ2-9): High Risk (12/17/2024)   Depression (PHQ2-9)    PHQ-2 Score: 12  Alcohol Screen: Not on file  Housing: Not on file  Utilities: Not on file  Health Literacy: Not on file       12/17/2024   10:15 AM  PHQ9 SCORE ONLY  PHQ-9 Total Score 12     Review of Systems  Constitutional:  Positive for malaise/fatigue.  Neurological:  Positive for headaches.  Psychiatric/Behavioral:  The patient is nervous/anxious.         Objective    BP 110/72   Pulse 82   Temp 98.2 F (36.8 C) (Temporal)   Ht 5' 3 (1.6 m)   Wt 160 lb 2 oz (72.6 kg)   SpO2 97%   BMI 28.36 kg/m   Physical Exam Vitals reviewed.  Constitutional:      General: She is not in acute distress.    Appearance: Normal appearance.  HENT:     Head: Normocephalic and atraumatic.  Cardiovascular:     Rate and Rhythm: Normal rate and regular rhythm.     Pulses: Normal pulses.     Heart sounds: Normal heart sounds.  Pulmonary:     Effort: Pulmonary effort is normal.     Breath sounds: Normal breath sounds.  Skin:    General: Skin is warm and dry.  Neurological:     General: No focal deficit present.     Mental Status: She is alert and oriented to person, place, and time.  Psychiatric:        Mood and Affect: Mood normal.        Behavior: Behavior normal.        Judgment: Judgment normal.         Assessment & Plan:   Problem List Items Addressed This Visit       Cardiovascular and Mediastinum   Migraine - Primary   Migraine Chronic migraines 2-3 times weekly, triggered by stress. Prefers non-medication management. - Referred to neurology for prevention options. - Discussed FMLA for absences, contingent on neurology follow-up. - Continue rest and peppermint oil.      Relevant Orders   Ambulatory referral to Neurology   CBC   Comprehensive metabolic panel with GFR   Hemoglobin A1c   Lipid panel   TSH   T3, free   T4, free   Vitamin D  (25 hydroxy)   Vitamin B12     Other   Diabetes mellitus screening   Diabetes mellitus screening Screening warranted due to weight gain and fatigue. - Ordered diabetes screening as part of lab panel.      Relevant Orders   CBC    Comprehensive metabolic panel with GFR   Hemoglobin A1c   Lipid panel   TSH   T3, free   T4, free   Vitamin D  (25 hydroxy)   Vitamin B12   Depression   Depressive symptoms and anxiety Moderate depression score of 12, impacted by stress and divorce. Prefers non-pharmacological management but open to medication. - Continue counseling sessions as feasible. - Provided handouts on Zoloft, Lexapro, and Buspar. - Discussed potential benefits and side effects  of antidepressants and Buspar. - Labs also ordered to evaluate for nutritional/vitamin deficiencies, thyroid deficiencies      Relevant Orders   CBC   Comprehensive metabolic panel with GFR   Hemoglobin A1c   Lipid panel   TSH   T3, free   T4, free   Vitamin D  (25 hydroxy)   Vitamin B12   Screening cholesterol level   Cholesterol screening - Ordered cholesterol screening as part of lab panel.      Relevant Orders   CBC   Comprehensive metabolic panel with GFR   Hemoglobin A1c   Lipid panel   TSH   T3, free   T4, free   Vitamin D  (25 hydroxy)   Vitamin B12   Fatigue   Fatigue Likely related to stress, migraines, and potential vitamin deficiencies. - Ordered lab tests including thyroid panel, vitamin D , and vitamin B levels.      Relevant Orders   CBC   Comprehensive metabolic panel with GFR   Hemoglobin A1c   Lipid panel   TSH   T3, free   T4, free   Vitamin D  (25 hydroxy)   Vitamin B12    Assessment and Plan Assessment & Plan Migraine Chronic migraines 2-3 times weekly, triggered by stress. Prefers non-medication management. - Referred to neurology for prevention options. - Discussed FMLA for absences, contingent on neurology follow-up. - Continue rest and peppermint oil.  Depressive symptoms and anxiety Moderate depression score of 12, impacted by stress and divorce. Prefers non-pharmacological management but open to medication. - Continue counseling sessions as feasible. - Provided handouts on  Zoloft, Lexapro, and Buspar. - Discussed potential benefits and side effects of antidepressants and Buspar. - Labs also ordered to evaluate for nutritional/vitamin deficiencies, thyroid deficiencies  Fatigue Likely related to stress, migraines, and potential vitamin deficiencies. - Ordered lab tests including thyroid panel, vitamin D , and vitamin B levels.  Diabetes mellitus screening Screening warranted due to weight gain and fatigue. - Ordered diabetes screening as part of lab panel.  Cholesterol screening - Ordered cholesterol screening as part of lab panel.    Return in about 6 weeks (around 01/28/2025) for CPE with Lauraine.   Lauraine FORBES Pereyra, NP   "

## 2024-12-17 NOTE — Assessment & Plan Note (Signed)
 Cholesterol screening - Ordered cholesterol screening as part of lab panel.

## 2024-12-17 NOTE — Assessment & Plan Note (Signed)
 Migraine Chronic migraines 2-3 times weekly, triggered by stress. Prefers non-medication management. - Referred to neurology for prevention options. - Discussed FMLA for absences, contingent on neurology follow-up. - Continue rest and peppermint oil.

## 2024-12-17 NOTE — Assessment & Plan Note (Signed)
 Fatigue Likely related to stress, migraines, and potential vitamin deficiencies. - Ordered lab tests including thyroid panel, vitamin D , and vitamin B levels.

## 2024-12-17 NOTE — Assessment & Plan Note (Signed)
 Diabetes mellitus screening Screening warranted due to weight gain and fatigue. - Ordered diabetes screening as part of lab panel.

## 2024-12-17 NOTE — Assessment & Plan Note (Signed)
 Depressive symptoms and anxiety Moderate depression score of 12, impacted by stress and divorce. Prefers non-pharmacological management but open to medication. - Continue counseling sessions as feasible. - Provided handouts on Zoloft, Lexapro, and Buspar. - Discussed potential benefits and side effects of antidepressants and Buspar. - Labs also ordered to evaluate for nutritional/vitamin deficiencies, thyroid deficiencies

## 2025-02-12 ENCOUNTER — Encounter: Admitting: Nurse Practitioner

## 2025-04-30 ENCOUNTER — Ambulatory Visit: Admitting: Diagnostic Neuroimaging
